# Patient Record
Sex: Female | Born: 1980 | Race: White | Hispanic: No | Marital: Married | Smoking: Never smoker
Health system: Southern US, Community
[De-identification: ages and names within clinical notes are randomized; demographics above are authoritative.]

## PROBLEM LIST (undated history)

## (undated) DIAGNOSIS — R600 Localized edema: Secondary | ICD-10-CM

## (undated) DIAGNOSIS — E282 Polycystic ovarian syndrome: Secondary | ICD-10-CM

## (undated) DIAGNOSIS — I10 Essential (primary) hypertension: Secondary | ICD-10-CM

## (undated) DIAGNOSIS — M549 Dorsalgia, unspecified: Secondary | ICD-10-CM

## (undated) DIAGNOSIS — F32A Depression, unspecified: Secondary | ICD-10-CM

## (undated) DIAGNOSIS — F329 Major depressive disorder, single episode, unspecified: Secondary | ICD-10-CM

## (undated) HISTORY — DX: Essential (primary) hypertension: I10

## (undated) HISTORY — PX: OTHER SURGICAL HISTORY: SHX169

## (undated) HISTORY — DX: Depression, unspecified: F32.A

## (undated) HISTORY — DX: Polycystic ovarian syndrome: E28.2

## (undated) HISTORY — DX: Localized edema: R60.0

## (undated) HISTORY — DX: Dorsalgia, unspecified: M54.9

---
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
## 2003-02-26 ENCOUNTER — Other Ambulatory Visit: Admission: RE | Admit: 2003-02-26 | Discharge: 2003-02-26 | Payer: Self-pay | Admitting: Obstetrics and Gynecology

## 2003-11-17 ENCOUNTER — Emergency Department (HOSPITAL_COMMUNITY): Admission: EM | Admit: 2003-11-17 | Discharge: 2003-11-17 | Payer: Self-pay | Admitting: Family Medicine

## 2004-04-22 ENCOUNTER — Other Ambulatory Visit: Admission: RE | Admit: 2004-04-22 | Discharge: 2004-04-22 | Payer: Self-pay | Admitting: Obstetrics and Gynecology

## 2004-11-23 ENCOUNTER — Encounter: Admission: RE | Admit: 2004-11-23 | Discharge: 2004-11-23 | Payer: Self-pay | Admitting: Internal Medicine

## 2005-05-19 ENCOUNTER — Other Ambulatory Visit: Admission: RE | Admit: 2005-05-19 | Discharge: 2005-05-19 | Payer: Self-pay | Admitting: Obstetrics and Gynecology

## 2007-04-13 ENCOUNTER — Ambulatory Visit: Payer: Self-pay | Admitting: Family Medicine

## 2007-04-13 ENCOUNTER — Encounter: Admission: RE | Admit: 2007-04-13 | Discharge: 2007-04-13 | Payer: Self-pay | Admitting: Family Medicine

## 2007-04-17 ENCOUNTER — Telehealth: Payer: Self-pay | Admitting: *Deleted

## 2008-03-26 ENCOUNTER — Inpatient Hospital Stay (HOSPITAL_COMMUNITY): Admission: AD | Admit: 2008-03-26 | Discharge: 2008-03-29 | Payer: Self-pay | Admitting: Obstetrics and Gynecology

## 2008-11-26 ENCOUNTER — Ambulatory Visit: Payer: Self-pay | Admitting: Family Medicine

## 2008-11-28 ENCOUNTER — Ambulatory Visit: Payer: Self-pay | Admitting: Family Medicine

## 2010-09-14 NOTE — Op Note (Signed)
[name], [name]               ACCOUNT NO.:  [id number]   MEDICAL RECORD NO.:  [id number]          PATIENT TYPE:  INP   LOCATION:  9122                          FACILITY:  [facility]   PHYSICIAN:  Sherron Monday, MD        DATE OF BIRTH:  27-Nov-1980   DATE OF PROCEDURE:  03/27/2008  DATE OF DISCHARGE:                               OPERATIVE REPORT   PREOPERATIVE DIAGNOSES:  Intrauterine pregnancy at term, arrest of  cervical dilatation.   POSTOPERATIVE DIAGNOSES:  Intrauterine pregnancy at term, arrest of  cervical dilatation, delivered.   PROCEDURE:  Primary low transverse cesarean section.   SURGEON:  Sherron Monday, MD   ANESTHESIA:  Epidural.   COMPLICATIONS:  None.   PATHOLOGY:  None.   FINDINGS:  Viable female infant at 2:26 a.m. with Apgars of 9 at 1  minute and 9 at 5 minutes, and a weight of 8 pounds 3 ounces.  Normal  uterus, tubes, and ovaries are noted.   ESTIMATED BLOOD LOSS:  700 mL.   URINE OUTPUT:  300 mL, clear urine at the end of procedure.   IV FLUIDS:  1700 mL.   DISPOSITION:  Stable to PACU following the procedure.   PROCEDURE:  After informed consent was reviewed, the patient including  the risks, benefits, and alternatives of surgical procedure, she was  transferred to the operating room where her epidural was dosed and she  was placed on the table in a supine position with a leftward tilt.  Pfannenstiel incision was made in the level of two fingerbreadths above  the pubic symphysis carried through the underlying layer of fascia  sharply.  The fascia was incised in midline.  The incision was extended  laterally with Mayo scissors.  Inferior aspect of the fascial incision  was grasped with Kocher clamps, elevating the rectus muscles that were  dissected off both bluntly and sharply.  In a similar fashion, the  superior aspect of the fascial incision was grasped with Kocher clamps,  elevating the rectus muscles that were dissected off both bluntly and  sharply.  The midline was easily identified.  Peritoneum was entered  bluntly.  Incision was extended superiorly and inferiorly with good  visualization of the bladder.  The Alexis skin retractor was placed and  carefully checking to make sure no bowel was entrapped.  The  vesicouterine peritoneum was easily identified and picked up with  pickups and using Metzenbaum scissors, bladder flap was created  digitally and sharply.  The uterus was then incised in a transverse  fashion.  The infant was delivered from a vertex presentation and  occiput posterior presentation without difficulty.  Nose and mouth were  suctioned on the field.  Cord was clamped and cut.  The infant was  handed off to awaiting pediatric staff.  The placenta was expressed from  the uterus.  The uterus was cleared of all clots and debris exteriorized  and the uterine incision was closed in 2 layers with 0 Monocryl, the  first which was running locked, the second was imbricating.  The pelvis  was irrigated  copiously.  The uterus was returned to its place.  Hemostasis was assured.  The Alexis retractor was removed.  The  subfascial planes were inspected, found to be hemostatic.  The fascia  was closed in 0 Vicryl in a running fashion.  Subcuticular adipose layer  was irrigated and made hemostatic with Bovie cautery, 3-0 plain gut  incision was placed to reapproximated adipose tissue.  The skin was  closed with staples.  Sponge, lap, and needle counts were correct x2 at  the end of the procedure.  The patient tolerated the procedure well and  sent in stable condition to the PACU.      Sherron Monday, MD  Electronically Signed     JB/MEDQ  D:  03/27/2008  T:  03/27/2008  Job:  [id number]

## 2010-09-14 NOTE — Discharge Summary (Signed)
[name], [name]               ACCOUNT NO.:  [id number]   MEDICAL RECORD NO.:  [id number]          PATIENT TYPE:  INP   LOCATION:  9122                          FACILITY:  [facility]   PHYSICIAN:  Zenaida Niece, M.D.DATE OF BIRTH:  09-17-1980   DATE OF ADMISSION:  03/26/2008  DATE OF DISCHARGE:  03/29/2008                               DISCHARGE SUMMARY   ADMISSION DIAGNOSIS:  Intrauterine pregnancy at 40 weeks.   DISCHARGE DIAGNOSES:  1. Intrauterine pregnancy at 40 weeks.  2. Arrest of dilation.   PROCEDURES:  On March 27, 2008, Dr. Ellyn Hack, performed primary  cesarean section.   HISTORY AND PHYSICAL:  This is a 30 year old gravida 1, para 0 with an  EGA of [redacted] weeks' who presents with increasing contractions.  Prenatal  care was essentially uncomplicated.   PRENATAL LABS:  Blood type is A positive with negative antibody screen,  gonorrhea and Chlamydia negative, RPR nonreactive, rubella equivocal,  hepatitis B surface antigen negative, HIV negative, group B strep is  negative, 1-hour Glucola 105, cystic fibrosis negative.   PAST HISTORY:  Essentially noncontributory.   PHYSICAL EXAMINATION:  She is afebrile with stable vital signs.  Fetal  heart tracing reactive with contractions every 3-5 minutes.  Abdomen is  soft.  Fundus is nontender.  Cervical exam is not mentioned, although  membranes were ruptured for light meconium.  PIH labs were normal are  reported as normal.   HOSPITAL COURSE:  The patient was admitted and had a protracted course.  On the evening of March 26, 2008, Dr. Ellyn Hack inserted an IUPC.  Early  on the morning of March 27, 2008, she had been 7 cm for greater than  2 hours.  Dr. Ellyn Hack took her to the operating room and performed  primary low transverse cesarean section under epidural anesthesia.  She  delivered a viable female infant with Apgars of 9 and 9, weight 8 pounds  3 ounces.  The patient had normal anatomy and estimated blood loss was  700 mL.  Postoperatively, she had no significant complications.  Predelivery hemoglobin is 11.9 and postoperative is 8.6.  On  postoperative #2, the patient was doing well and requested discharge  home.  Her incision was healing well, and she was felt to be stable  enough for discharge home.   DISCHARGE INSTRUCTIONS:  Regular diet, pelvic rest, no strenuous  activity.  Follow up is in 3-4 days for staple removal.   MEDICATIONS:  Percocet, #30, one to two p.o. q.4-6 h. p.r.n. pain and  over-the-counter ibuprofen as needed and she was given our discharge  pamphlet.      Zenaida Niece, M.D.  Electronically Signed     TDM/MEDQ  D:  03/29/2008  T:  03/29/2008  Job:  [id number]

## 2011-02-01 LAB — LACTATE DEHYDROGENASE: LDH: 337 — ABNORMAL HIGH

## 2011-02-01 LAB — CBC
HCT: 25.2 — ABNORMAL LOW
Hemoglobin: 11.9 — ABNORMAL LOW
MCHC: 33.9
MCHC: 34
MCHC: 34.1
MCV: 89.1
MCV: 89.6
Platelets: 176
RBC: 2.81 — ABNORMAL LOW
RBC: 3.95
RDW: 14.7
RDW: 15.1
WBC: 11.8 — ABNORMAL HIGH

## 2011-02-01 LAB — COMPREHENSIVE METABOLIC PANEL
ALT: 9
AST: 31
Alkaline Phosphatase: 66
Chloride: 106
Creatinine, Ser: 0.54
GFR calc Af Amer: >60
Sodium: 137

## 2011-02-01 LAB — RPR: RPR Ser Ql: NONREACTIVE

## 2017-09-10 ENCOUNTER — Other Ambulatory Visit: Payer: Self-pay

## 2017-09-10 ENCOUNTER — Encounter (HOSPITAL_COMMUNITY): Payer: Self-pay | Admitting: Emergency Medicine

## 2017-09-10 ENCOUNTER — Emergency Department (HOSPITAL_COMMUNITY)
Admission: EM | Admit: 2017-09-10 | Discharge: 2017-09-10 | Disposition: A | Discharge status: Home / Self Care | Payer: Self-pay | Attending: Emergency Medicine | Admitting: Emergency Medicine

## 2017-09-10 DIAGNOSIS — R002 Palpitations: Secondary | ICD-10-CM | POA: Insufficient documentation

## 2017-09-10 DIAGNOSIS — R55 Syncope and collapse: Secondary | ICD-10-CM | POA: Insufficient documentation

## 2017-09-10 DIAGNOSIS — Z79899 Other long term (current) drug therapy: Secondary | ICD-10-CM | POA: Insufficient documentation

## 2017-09-10 LAB — CBC
HEMATOCRIT: 39.7 % (ref 36.0–46.0)
HEMOGLOBIN: 12.5 g/dL (ref 12.0–15.0)
MCH: 27 pg (ref 26.0–34.0)
MCHC: 31.5 g/dL (ref 30.0–36.0)
MCV: 85.7 fL (ref 78.0–100.0)
Platelets: 255 10*3/uL (ref 150–400)
RBC: 4.63 MIL/uL (ref 3.87–5.11)
RDW: 15.3 % (ref 11.5–15.5)
WBC: 10.4 10*3/uL (ref 4.0–10.5)

## 2017-09-10 LAB — BASIC METABOLIC PANEL
ANION GAP: 7 (ref 5–15)
BUN: 11 mg/dL (ref 6–20)
CHLORIDE: 108 mmol/L (ref 101–111)
CO2: 27 mmol/L (ref 22–32)
Calcium: 9.1 mg/dL (ref 8.9–10.3)
Creatinine, Ser: 0.89 mg/dL (ref 0.44–1.00)
GFR calc Af Amer: >60 mL/min (ref >60)
GLUCOSE: 140 mg/dL — AB (ref 65–99)
POTASSIUM: 4.1 mmol/L (ref 3.5–5.1)
Sodium: 142 mmol/L (ref 135–145)

## 2017-09-10 LAB — URINALYSIS, ROUTINE W REFLEX MICROSCOPIC
BILIRUBIN URINE: NEGATIVE
Glucose, UA: NEGATIVE mg/dL
Hgb urine dipstick: NEGATIVE
KETONES UR: NEGATIVE mg/dL
LEUKOCYTES UA: NEGATIVE
NITRITE: NEGATIVE
PH: 5 (ref 5.0–8.0)
Protein, ur: NEGATIVE mg/dL
SPECIFIC GRAVITY, URINE: 1.005 (ref 1.005–1.030)

## 2017-09-10 LAB — I-STAT BETA HCG BLOOD, ED (MC, WL, AP ONLY): I-stat hCG, quantitative: <5 m[IU]/mL (ref <5)

## 2017-09-10 MED ORDER — SODIUM CHLORIDE 0.9 % IV SOLN
1000.0000 mL | INTRAVENOUS | Status: DC
  Administered 2017-09-10: 1000 mL via INTRAVENOUS

## 2017-09-10 MED ORDER — SODIUM CHLORIDE 0.9 % IV BOLUS (SEPSIS)
1000.0000 mL | Freq: Once | INTRAVENOUS | Status: AC
  Administered 2017-09-10: 1000 mL via INTRAVENOUS

## 2017-09-10 NOTE — ED Provider Notes (Signed)
[facility] EMERGENCY DEPARTMENT Provider Note   CSN: [id number] Arrival date & time: 09/10/17  1430     History   Chief Complaint Chief Complaint  Patient presents with  . Near Syncope    HPI [name] is a 37 y.o. female.  HPI Pt was driving when she had sudden onset of feeling lightheaded, short of breath and her heart was racing.  Pt had to pull over on the side of the road.  She was hoping it would pass but it did not resolve.   After about an 1hr and 30 minutes it slowly resolved.  SHe is feeling better now.  She has never had this happen before although she has had some fainting spells after painful conditions etc. History reviewed. No pertinent past medical history.  There are no active problems to display for this patient.   Past Surgical History:  Procedure Laterality Date  . CESAREAN SECTION       OB History   None      Home Medications    Prior to Admission medications   Medication Sig Start Date End Date Taking? Authorizing Provider  ibuprofen (ADVIL,MOTRIN) 200 MG tablet Take 200 mg by mouth every 6 (six) hours as needed for fever, headache or mild pain.   Yes [provider]  ranitidine (ZANTAC) 150 MG tablet Take 150 mg by mouth 2 (two) times daily.   Yes [provider]    Family History No family history on file.  Social History Social History   Tobacco Use  . Smoking status: Not on file  Substance Use Topics  . Alcohol use: Not on file  . Drug use: Not on file     Allergies   Patient has no known allergies.   Review of Systems Review of Systems  Constitutional: Negative for fever.  Respiratory: Positive for shortness of breath. Negative for cough.   Cardiovascular: Negative for chest pain.  Gastrointestinal: Negative for abdominal pain, diarrhea and vomiting.  Genitourinary: Negative for dysuria.  Neurological: Negative for headaches.     Physical Exam Updated Vital Signs BP 132/88    Pulse 91   Resp 16   Ht 1.803 m ( )   Wt 113.4 kg (250 lb)   LMP  (Within Months)   SpO2 100%   BMI 34.87 kg/m   Physical Exam  Constitutional: She appears well-developed and well-nourished. No distress.  HENT:  Head: Normocephalic and atraumatic.  Right Ear: External ear normal.  Left Ear: External ear normal.  Eyes: Conjunctivae are normal. Right eye exhibits no discharge. Left eye exhibits no discharge. No scleral icterus.  Neck: Neck supple. No tracheal deviation present.  Cardiovascular: Normal rate, regular rhythm and intact distal pulses.  Pulmonary/Chest: Effort normal and breath sounds normal. No stridor. No respiratory distress. She has no wheezes. She has no rales.  Abdominal: Soft. Bowel sounds are normal. She exhibits no distension. There is no tenderness. There is no rebound and no guarding.  Musculoskeletal: She exhibits no edema or tenderness.  Neurological: She is alert. She has normal strength. No cranial nerve deficit (no facial droop, extraocular movements intact, no slurred speech) or sensory deficit. She exhibits normal muscle tone. She displays no seizure activity. Coordination normal.  Skin: Skin is warm and dry. No rash noted.  Psychiatric: She has a normal mood and affect.  Nursing note and vitals reviewed.    ED Treatments / Results  Labs (all labs ordered are listed, but only abnormal  results are displayed) Labs Reviewed  BASIC METABOLIC PANEL - Abnormal; Notable for the following components:      Result Value   Glucose, Bld 140 (*)    All other components within normal limits  URINALYSIS, ROUTINE W REFLEX MICROSCOPIC - Abnormal; Notable for the following components:   APPearance HAZY (*)    All other components within normal limits  CBC  I-STAT BETA HCG BLOOD, ED ([facility], [facility], [facility] ONLY)  CBG MONITORING, ED    EKG EKG Interpretation  Date/Time:  Sunday Sep 10 2017 14:38:43 EDT Ventricular Rate:  116 PR Interval:  154 QRS Duration: 82 QT  Interval:  312 QTC Calculation: 433 R Axis:   66 Text Interpretation:  Sinus tachycardia Otherwise normal ECG No old tracing to compare Confirmed by Linwood Dibbles [phone]) on 09/10/2017 3:49:11 PM   Radiology No results found.  Procedures Procedures (including critical care time)  Medications Ordered in ED Medications  sodium chloride 0.9 % bolus 1,000 mL (1,000 mLs Intravenous New Bag/Given 09/10/17 1741)    Followed by  0.9 %  sodium chloride infusion (1,000 mLs Intravenous New Bag/Given 09/10/17 1742)     Initial Impression / Assessment and Plan / ED Course  I have reviewed the triage vital signs and the nursing notes.  Pertinent labs & imaging results that were available during my care of the patient were reviewed by me and considered in my medical decision making (see chart for details).   Pt presented to the ED for evaluation of near syncope.  Patient had an episode where she felt like she was going to pass out her heart was racing.  Symptoms pretty much resolved by the time she arrived here.  Her EKG did not show any dysrhythmia other than a sinus tachycardia.  Patient's laboratory tests are reassuring.  He heart rhythm has rate has returned to a normal rate.  She does not have any complaints of headache or chest pain.  No abdominal pain.  No signs of acute infection.  No anemia or dehydration.  It is possible the patient may have had an episode of a supraventricular tachycardia.  Discussed outpatient follow-up with her primary care doctor.  If she has any recurrent symptoms they may consider an outpatient Holter monitor.  Final Clinical Impressions(s) / ED Diagnoses   Final diagnoses:  Near syncope  Palpitations    ED Discharge Orders    None       Linwood Dibbles, MD 09/10/17 1821

## 2017-09-10 NOTE — Discharge Instructions (Addendum)
Follow-up with your primary care doctor, discuss possible outpatient heart monitor, return as needed for recurrent symptoms

## 2017-09-10 NOTE — ED Triage Notes (Signed)
Patient complains of sudden near syncope while driving. Patient states she has never felt anything similar before. Patient reports feeling like her heart is racing and feeling like she will pass out.

## 2017-09-11 ENCOUNTER — Other Ambulatory Visit (HOSPITAL_COMMUNITY): Payer: Self-pay | Admitting: *Deleted

## 2017-09-11 DIAGNOSIS — I471 Supraventricular tachycardia: Secondary | ICD-10-CM

## 2017-09-11 MED ORDER — METOPROLOL TARTRATE 25 MG PO TABS: 25.0000 mg | ORAL_TABLET | Freq: Two times a day (BID) | ORAL | 3 refills | Status: DC

## 2019-08-27 ENCOUNTER — Ambulatory Visit (INDEPENDENT_AMBULATORY_CARE_PROVIDER_SITE_OTHER): Payer: Medicaid Other | Admitting: Family Medicine

## 2019-08-27 ENCOUNTER — Other Ambulatory Visit: Payer: Self-pay

## 2019-08-27 ENCOUNTER — Encounter (INDEPENDENT_AMBULATORY_CARE_PROVIDER_SITE_OTHER): Payer: Self-pay | Admitting: Family Medicine

## 2019-08-27 VITALS — BP 134/91 | HR 91 | Temp 98.5°F | Ht 71.0 in | Wt 301.0 lb

## 2019-08-27 DIAGNOSIS — E282 Polycystic ovarian syndrome: Secondary | ICD-10-CM | POA: Diagnosis not present

## 2019-08-27 DIAGNOSIS — R0602 Shortness of breath: Secondary | ICD-10-CM | POA: Diagnosis not present

## 2019-08-27 DIAGNOSIS — I10 Essential (primary) hypertension: Secondary | ICD-10-CM

## 2019-08-27 DIAGNOSIS — Z1331 Encounter for screening for depression: Secondary | ICD-10-CM

## 2019-08-27 DIAGNOSIS — R5383 Other fatigue: Secondary | ICD-10-CM

## 2019-08-27 DIAGNOSIS — Z6841 Body Mass Index (BMI) 40.0 and over, adult: Secondary | ICD-10-CM

## 2019-08-27 DIAGNOSIS — R7301 Impaired fasting glucose: Secondary | ICD-10-CM

## 2019-08-27 DIAGNOSIS — Z0289 Encounter for other administrative examinations: Secondary | ICD-10-CM

## 2019-08-27 NOTE — Progress Notes (Signed)
Chief Complaint:   OBESITY [name] (MR# [id number]) is a 39 y.o. female who presents for evaluation and treatment of obesity and related comorbidities. Current BMI is Body mass index is 41.98 kg/m. [name] has been struggling with her weight for many years and has been unsuccessful in either losing weight, maintaining weight loss, or reaching her healthy weight goal.  [name] is currently in the action stage of change and ready to dedicate time achieving and maintaining a healthier weight. [name] is interested in becoming our patient and working on intensive lifestyle modifications including (but not limited to) diet and exercise for weight loss.  [name] has a history of PCOS.  Her weight gain has occurred over a few years. She prefers a low carb diet.  [name]'s habits were reviewed today and are as follows: Her family eats meals together, she thinks her family will eat healthier with her, her desired weight loss is 130 pounds, she has been heavy most of her adult life, her heaviest weight ever was 300 pounds, she craves sugary foods, she skips breakfast a few times a week, she is frequently drinking liquids with calories, she frequently makes poor food choices, she sometimes eats larger portions than normal and she struggles with emotional eating.  Depression Screen [name]'s Food and Mood (modified18 PHQ-9) Wong was 18.  Depression screen [facility] 2/9 08/27/2019  Decreased Interest 3  Down, Depressed, Hopeless 3  PHQ - 2 Wong 6  Altered sleeping 2  Tired, decreased energy 3  Change in appetite 3  Feeling bad or failure about yourself  3  Trouble concentrating 0  Moving slowly or fidgety/restless 1  Suicidal thoughts 0  PHQ-9 Wong 18  Difficult doing work/chores Somewhat difficult   Subjective:   1. Fatigue, unspecified type Norma denies daytime somnolence and reports waking up still tired. Patent has a history of symptoms of morning fatigue. [name] generally gets 7 or 8 hours of sleep per  night, and states that she has generally restful sleep. [name] is present if she is on her back. [name] episodes are not present. [name] is 2.  2. SOB (shortness of breath) on exertion [facility] notes increasing shortness of breath with exercising and seems to be worsening over time with weight gain. She notes getting out of breath sooner with activity than she used to. This has gotten worse recently. [name] denies shortness of breath at rest or orthopnea.  3. PCOS (polycystic ovarian syndrome) [name] was having a hard time with conception and was diagnosed with PCOS at that time.  4. Essential hypertension Review: taking medications as instructed, no medication side effects noted, no chest pain on exertion, no dyspnea on exertion, no swelling of ankles.  Blood pressure has been elevated over the last 6 months.  She tried a beta blocker and it made her tired.  BP Readings from Last 3 Encounters:  08/27/19 (!) 134/91  09/10/17 132/88   5. Fasting hyperglycemia [name] has a history of some elevated blood glucose readings without a diagnosis of diabetes. She denies polyphagia.  6. Depression screening [name] was screened for depression as part of her new patient workup today.  Assessment/Plan:   1. Fatigue, unspecified type [name] does not feel that her weight is causing her energy to be lower than it should be. Fatigue may be related to obesity, depression or many other causes. Labs will be ordered, and in the meanwhile, [name] will focus on self care including making healthy food choices, increasing physical  activity and focusing on stress reduction.  Orders - EKG 12-Lead  2. SOB (shortness of breath) on exertion [name] does not feel that she gets out of breath more easily that she used to when she exercises. [name]'s shortness of breath appears to be obesity related and exercise induced. She has agreed to work on weight loss and gradually increase exercise to treat her exercise induced  shortness of breath. Will continue to monitor closely.  3. PCOS (polycystic ovarian syndrome) Intensive lifestyle modifications are first line treatment for this issue. We discussed several lifestyle modifications today and she will continue to work on diet, exercise and weight loss efforts. Orders and follow up as documented in patient record.  Counseling . PCOS is a leading cause of menstrual irregularities and infertility. It is also associated with obesity, hirsutism (excessive hair growth on the face, chest, or back), and cardiovascular risk factors such as high cholesterol and insulin resistance. . Insulin resistance appears to play a central role.  . Women with PCOS have been shown to have impaired appetite-regulating hormones. . Metformin is one medication that can improve metabolic parameters.  . Women with polycystic ovary syndrome (PCOS) have an increased risk for cardiovascular disease (CVD) - European Journal of Preventive Cardiology.  4. Essential hypertension [name] is working on healthy weight loss and exercise to improve blood pressure control. We will watch for signs of hypotension as she continues her lifestyle modifications.  5. Fasting hyperglycemia Fasting labs will be obtained and results with be discussed with [name] in 2 weeks at her follow up visit. In the meanwhile [name] was started on a lower simple carbohydrate diet and will work on weight loss efforts.  6. Depression screening [name] had a positive depression screening. Depression is commonly associated with obesity and often results in emotional eating behaviors. We will monitor this closely and work on CBT to help improve the non-hunger eating patterns. Referral to Psychology may be required if no improvement is seen as she continues in our clinic.  PHQ-9 was 18 today.  7. Class 3 severe obesity with serious comorbidity and body mass index (BMI) of 40.0 to 44.9 in adult, unspecified obesity type [facility]) [name] is currently  in the action stage of change and her goal is to continue with weight loss efforts. I recommend Spirit begin the structured treatment plan as follows:  She has agreed to the Category 4 Plan.  Exercise goals: No exercise has been prescribed at this time.   Behavioral modification strategies: increasing lean protein intake, decreasing simple carbohydrates, increasing vegetables, increasing water intake and decreasing liquid calories.  She was informed of the importance of frequent follow-up visits to maximize her success with intensive lifestyle modifications for her multiple health conditions. She was informed we would discuss her lab results at her next visit unless there is a critical issue that needs to be addressed sooner. Keali agreed to keep her next visit at the agreed upon time to discuss these results.  Orders Placed This Encounter  Procedures  . Comprehensive metabolic panel  . CBC with Differential/Platelet  . Hemoglobin A1c  . Insulin, random  . Lipid panel  . VITAMIN D 25 Hydroxy (Vit-D Deficiency, Fractures)  . TSH  . T4, free  . T3  . Anemia panel  . EKG 12-Lead   Objective:   Blood pressure (!) 134/91, pulse 91, temperature 98.5 F (36.9 C), temperature source Oral, height 5\' 11"  (1.803 m), weight (!) 301 lb (136.5 kg), last menstrual period 06/28/2019, SpO2  98 %. Body mass index is 41.98 kg/m.  EKG: Normal sinus rhythm, rate 88 bpm.  Indirect Calorimeter completed today shows a VO2 of 378 and a REE of 2631.  Her calculated basal metabolic rate is 1761 thus her basal metabolic rate is better than expected.  General: Cooperative, alert, well developed, in no acute distress. HEENT: Conjunctivae and lids unremarkable. Cardiovascular: Regular rhythm.  Lungs: Normal work of breathing. Neurologic: No focal deficits.   Lab Results  Component Value Date   CREATININE 0.89 09/10/2017   BUN 11 09/10/2017   NA 142 09/10/2017   K 4.1 09/10/2017   CL 108 09/10/2017   CO2  27 09/10/2017   Lab Results  Component Value Date   ALT 9 03/26/2008   AST 31 03/26/2008   ALKPHOS 66 03/26/2008   BILITOT 1.5 (H) 03/26/2008   Lab Results  Component Value Date   WBC 10.4 09/10/2017   HGB 12.5 09/10/2017   HCT 39.7 09/10/2017   MCV 85.7 09/10/2017   PLT 255 09/10/2017   Attestation Statements:   This is the patient's first visit at [facility]. The patient's NEW PATIENT PACKET was reviewed at length. Included in the packet: current and past health history, medications, allergies, ROS, gynecologic history (women only), surgical history, family history, social history, weight history, weight loss surgery history (for those that have had weight loss surgery), nutritional evaluation, mood and food questionnaire, PHQ9, Dorothy questionnaire, sleep habits questionnaire, patient life and health improvement goals questionnaire. These will all be scanned into the patient's chart under media.   During the visit, I independently reviewed the patient's EKG, bioimpedance scale results, and indirect calorimeter results. I used this information to tailor a meal plan for the patient that will help her to lose weight and will improve her obesity-related conditions going forward. I performed a medically necessary appropriate examination and/or evaluation. I discussed the assessment and treatment plan with the patient. The patient was provided an opportunity to ask questions and all were answered. The patient agreed with the plan and demonstrated an understanding of the instructions. Labs were ordered at this visit and will be reviewed at the next visit unless more critical results need to be addressed immediately. Clinical information was updated and documented in the EMR.   I, Insurance claims handler, CMA, am acting as Energy manager for [employer], DO.  I have reviewed the above documentation for accuracy and completeness, and I agree with the above. Helane Rima, DO

## 2019-08-28 LAB — COMPREHENSIVE METABOLIC PANEL
ALT: 21 IU/L (ref 0–32)
AST: 25 IU/L (ref 0–40)
Albumin/Globulin Ratio: 1.7 (ref 1.2–2.2)
Albumin: 4.3 g/dL (ref 3.8–4.8)
Alkaline Phosphatase: 87 IU/L (ref 39–117)
BUN/Creatinine Ratio: 14 (ref 9–23)
BUN: 10 mg/dL (ref 6–20)
Bilirubin Total: 0.4 mg/dL (ref 0.0–1.2)
CO2: 23 mmol/L (ref 20–29)
Calcium: 9.3 mg/dL (ref 8.7–10.2)
Chloride: 106 mmol/L (ref 96–106)
Creatinine, Ser: 0.69 mg/dL (ref 0.57–1.00)
GFR calc Af Amer: 128 mL/min/{1.73_m2} (ref >59)
GFR calc non Af Amer: 111 mL/min/{1.73_m2} (ref >59)
Globulin, Total: 2.6 g/dL (ref 1.5–4.5)
Glucose: 116 mg/dL — ABNORMAL HIGH (ref 65–99)
Potassium: 4.4 mmol/L (ref 3.5–5.2)
Sodium: 141 mmol/L (ref 134–144)
Total Protein: 6.9 g/dL (ref 6.0–8.5)

## 2019-08-28 LAB — CBC WITH DIFFERENTIAL/PLATELET
Basophils Absolute: 0 10*3/uL (ref 0.0–0.2)
Basos: 0 %
EOS (ABSOLUTE): 0.2 10*3/uL (ref 0.0–0.4)
Eos: 2 %
Hemoglobin: 12.7 g/dL (ref 11.1–15.9)
Immature Grans (Abs): 0.1 10*3/uL (ref 0.0–0.1)
Immature Granulocytes: 1 %
Lymphocytes Absolute: 2.2 10*3/uL (ref 0.7–3.1)
Lymphs: 24 %
MCH: 27 pg (ref 26.6–33.0)
MCHC: 31.7 g/dL (ref 31.5–35.7)
MCV: 85 fL (ref 79–97)
Monocytes Absolute: 0.4 10*3/uL (ref 0.1–0.9)
Monocytes: 5 %
Neutrophils Absolute: 6.4 10*3/uL (ref 1.4–7.0)
Neutrophils: 68 %
Platelets: 219 10*3/uL (ref 150–450)
RBC: 4.7 x10E6/uL (ref 3.77–5.28)
RDW: 13.9 % (ref 11.7–15.4)
WBC: 9.2 10*3/uL (ref 3.4–10.8)

## 2019-08-28 LAB — ANEMIA PANEL
Ferritin: 38 ng/mL (ref 15–150)
Folate, Hemolysate: 414 ng/mL
Folate, RBC: 1032 ng/mL (ref >498)
Hematocrit: 40.1 % (ref 34.0–46.6)
Iron Saturation: 24 % (ref 15–55)
Iron: 85 ug/dL (ref 27–159)
Retic Ct Pct: 1.8 % (ref 0.6–2.6)
Total Iron Binding Capacity: 361 ug/dL (ref 250–450)
UIBC: 276 ug/dL (ref 131–425)
Vitamin B-12: 569 pg/mL (ref 232–1245)

## 2019-08-28 LAB — TSH: TSH: 2.56 u[IU]/mL (ref 0.450–4.500)

## 2019-08-28 LAB — T3: T3, Total: 169 ng/dL (ref 71–180)

## 2019-08-28 LAB — LIPID PANEL
Chol/HDL Ratio: 4.9 ratio — ABNORMAL HIGH (ref 0.0–4.4)
Cholesterol, Total: 183 mg/dL (ref 100–199)
HDL: 37 mg/dL — ABNORMAL LOW (ref >39)
LDL Chol Calc (NIH): 122 mg/dL — ABNORMAL HIGH (ref 0–99)
Triglycerides: 133 mg/dL (ref 0–149)
VLDL Cholesterol Cal: 24 mg/dL (ref 5–40)

## 2019-08-28 LAB — HEMOGLOBIN A1C
Est. average glucose Bld gHb Est-mCnc: 143 mg/dL
Hgb A1c MFr Bld: 6.6 % — ABNORMAL HIGH (ref 4.8–5.6)

## 2019-08-28 LAB — VITAMIN D 25 HYDROXY (VIT D DEFICIENCY, FRACTURES): Vit D, 25-Hydroxy: 37.1 ng/mL (ref 30.0–100.0)

## 2019-08-28 LAB — T4, FREE: Free T4: 0.93 ng/dL (ref 0.82–1.77)

## 2019-08-28 LAB — INSULIN, RANDOM: INSULIN: 52.8 u[IU]/mL — ABNORMAL HIGH (ref 2.6–24.9)

## 2019-09-10 ENCOUNTER — Ambulatory Visit (INDEPENDENT_AMBULATORY_CARE_PROVIDER_SITE_OTHER): Payer: Medicaid Other | Admitting: Family Medicine

## 2019-09-10 ENCOUNTER — Encounter (INDEPENDENT_AMBULATORY_CARE_PROVIDER_SITE_OTHER): Payer: Self-pay | Admitting: Family Medicine

## 2019-09-10 ENCOUNTER — Other Ambulatory Visit: Payer: Self-pay

## 2019-09-10 VITALS — BP 132/84 | HR 89 | Temp 98.5°F | Ht 71.0 in | Wt 298.0 lb

## 2019-09-10 DIAGNOSIS — E785 Hyperlipidemia, unspecified: Secondary | ICD-10-CM

## 2019-09-10 DIAGNOSIS — Z6841 Body Mass Index (BMI) 40.0 and over, adult: Secondary | ICD-10-CM

## 2019-09-10 DIAGNOSIS — E1169 Type 2 diabetes mellitus with other specified complication: Secondary | ICD-10-CM | POA: Diagnosis not present

## 2019-09-10 DIAGNOSIS — E119 Type 2 diabetes mellitus without complications: Secondary | ICD-10-CM

## 2019-09-10 DIAGNOSIS — R790 Abnormal level of blood mineral: Secondary | ICD-10-CM | POA: Diagnosis not present

## 2019-09-10 MED ORDER — METFORMIN HCL 500 MG PO TABS: 500.0000 mg | ORAL_TABLET | Freq: Every day | ORAL | 0 refills | Status: DC

## 2019-09-10 NOTE — Progress Notes (Signed)
Chief Complaint:   OBESITY [name] is here to discuss her progress with her obesity treatment plan along with follow-up of her obesity related diagnoses. [name] is on the Category 4 Plan and states she is following her eating plan approximately 100% of the time. [name] states she is exercising for 0 minutes 0 times per week.  Today's visit was #: 2 Starting weight: 301 lbs Starting date: 08/27/2019 Today's weight: 298 lbs Today's date: 09/10/2019 Total lbs lost to date: 3 lbs Total lbs lost since last in-office visit: 3 lbs  Interim History: [name] says she is tolerating the meal plan.  She has been craving sweets.  Subjective:   1. Type 2 diabetes mellitus without complication, without long-term current use of insulin ([name]) New onset.  Lab Results  Component Value Date   HGBA1C 6.6 (H) 08/27/2019   Lab Results  Component Value Date   LDLCALC 122 (H) 08/27/2019   CREATININE 0.69 08/27/2019   Lab Results  Component Value Date   INSULIN 52.8 (H) 08/27/2019   2. Hyperlipidemia associated with type 2 diabetes mellitus ([name]) [name] has hyperlipidemia and has been trying to improve her cholesterol levels with intensive lifestyle modification including a low saturated fat diet, exercise and weight loss. She denies any chest pain, claudication or myalgias.  Lab Results  Component Value Date   ALT 21 08/27/2019   AST 25 08/27/2019   ALKPHOS 87 08/27/2019   BILITOT 0.4 08/27/2019   Lab Results  Component Value Date   CHOL 183 08/27/2019   HDL 37 (L) 08/27/2019   LDLCALC 122 (H) 08/27/2019   TRIG 133 08/27/2019   CHOLHDL 4.9 (H) 08/27/2019   3. Low ferritin [name] is not a vegetarian.  She does not have a history of weight loss surgery.   CBC Latest Ref Rng & Units 08/27/2019 09/10/2017 03/28/2008  WBC 3.4 - 10.8 x10E3/uL 9.2 10.4 11.8(H)  Hemoglobin 11.1 - 15.9 g/dL 12.7 12.5 8.6 DELTA CHECK NOTED(L)  Hematocrit 34.0 - 46.6 % 40.1 39.7 25.2(L)  Platelets 150 - 450 x10E3/uL 219  255 176 DELTA CHECK NOTED   Lab Results  Component Value Date   IRON 85 08/27/2019   TIBC 361 08/27/2019   FERRITIN 38 08/27/2019   Lab Results  Component Value Date   VITAMINB12 569 08/27/2019   Assessment/Plan:   1. Type 2 diabetes mellitus without complication, without long-term current use of insulin (HCC) Good blood sugar control is important to decrease the likelihood of diabetic complications such as nephropathy, neuropathy, limb loss, blindness, coronary artery disease, and death. Intensive lifestyle modification including diet, exercise and weight loss are the first line of treatment for diabetes.   Orders - metFORMIN (GLUCOPHAGE) 500 MG tablet; Take 1 tablet (500 mg total) by mouth daily.  Dispense: 30 tablet; Refill: 0  2. Hyperlipidemia associated with type 2 diabetes mellitus ([place]) Cardiovascular risk and specific lipid/LDL goals reviewed.  We discussed several lifestyle modifications today and [name] will continue to work on diet, exercise and weight loss efforts. Orders and follow up as documented in patient record.   Counseling Intensive lifestyle modifications are the first line treatment for this issue. . Dietary changes: Increase soluble fiber. Decrease simple carbohydrates. . Exercise changes: Moderate to vigorous-intensity aerobic activity 150 minutes per week if tolerated. . Lipid-lowering medications: see documented in medical record.  3. Low ferritin Will continue to monitor.  4. Class 3 severe obesity with serious comorbidity and body mass index (BMI) of 40.0 to 44.9  in adult, unspecified obesity type [name]) [name] is currently in the action stage of change. As such, her goal is to continue with weight loss efforts. She has agreed to the Category 4 Plan.   Exercise goals: For substantial health benefits, adults should do at least 150 minutes (2 hours and 30 minutes) a week of moderate-intensity, or 75 minutes (1 hour and 15 minutes) a week of vigorous-intensity  aerobic physical activity, or an equivalent combination of moderate- and vigorous-intensity aerobic activity. Aerobic activity should be performed in episodes of at least 10 minutes, and preferably, it should be spread throughout the week.  Behavioral modification strategies: increasing lean protein intake and increasing vegetables.  [name] has agreed to follow-up with our clinic in 2 weeks. She was informed of the importance of frequent follow-up visits to maximize her success with intensive lifestyle modifications for her multiple health conditions.   Objective:   Blood pressure 132/84, pulse 89, temperature 98.5 F (36.9 C), temperature source Oral, height 5\' 11"  (1.803 m), weight 298 lb (135.2 kg), SpO2 95 %. Body mass index is 41.56 kg/m.  General: Cooperative, alert, well developed, in no acute distress. HEENT: Conjunctivae and lids unremarkable. Cardiovascular: Regular rhythm.  Lungs: Normal work of breathing. Neurologic: No focal deficits.   Lab Results  Component Value Date   CREATININE 0.69 08/27/2019   BUN 10 08/27/2019   NA 141 08/27/2019   K 4.4 08/27/2019   CL 106 08/27/2019   CO2 23 08/27/2019   Lab Results  Component Value Date   ALT 21 08/27/2019   AST 25 08/27/2019   ALKPHOS 87 08/27/2019   BILITOT 0.4 08/27/2019   Lab Results  Component Value Date   HGBA1C 6.6 (H) 08/27/2019   Lab Results  Component Value Date   INSULIN 52.8 (H) 08/27/2019   Lab Results  Component Value Date   TSH 2.560 08/27/2019   Lab Results  Component Value Date   CHOL 183 08/27/2019   HDL 37 (L) 08/27/2019   LDLCALC 122 (H) 08/27/2019   TRIG 133 08/27/2019   CHOLHDL 4.9 (H) 08/27/2019   Lab Results  Component Value Date   WBC 9.2 08/27/2019   HGB 12.7 08/27/2019   HCT 40.1 08/27/2019   MCV 85 08/27/2019   PLT 219 08/27/2019   Lab Results  Component Value Date   IRON 85 08/27/2019   TIBC 361 08/27/2019   FERRITIN 38 08/27/2019   Obesity Behavioral Intervention:    Approximately 15 minutes were spent on the discussion below.  ASK: We discussed the diagnosis of obesity with [name] today and [name] agreed to give [name] permission to discuss obesity behavioral modification therapy today.  ASSESS: [name] has the diagnosis of obesity and her BMI today is 41.6. [name] is in the action stage of change.   ADVISE: [name] was educated on the multiple health risks of obesity as well as the benefit of weight loss to improve her health. She was advised of the need for long term treatment and the importance of lifestyle modifications to improve her current health and to decrease her risk of future health problems.  AGREE: Multiple dietary modification options and treatment options were discussed and [name] agreed to follow the recommendations documented in the above note.  ARRANGE: [name] was educated on the importance of frequent visits to treat obesity as outlined per CMS and USPSTF guidelines and agreed to schedule her next follow up appointment today.  Attestation Statements:   Reviewed by clinician on day of visit:  allergies, medications, problem list, medical history, surgical history, family history, social history, and previous encounter notes.  I, Insurance claims handler, CMA, am acting as Energy manager for [employer], DO.  I have reviewed the above documentation for accuracy and completeness, and I agree with the above. Helane Rima, DO

## 2019-09-18 ENCOUNTER — Encounter (INDEPENDENT_AMBULATORY_CARE_PROVIDER_SITE_OTHER): Payer: Self-pay | Admitting: Family Medicine

## 2019-09-19 NOTE — Telephone Encounter (Signed)
Please advise. Thanks.  

## 2019-09-23 NOTE — Telephone Encounter (Signed)
FYI

## 2019-10-07 ENCOUNTER — Other Ambulatory Visit: Payer: Self-pay

## 2019-10-07 ENCOUNTER — Ambulatory Visit (INDEPENDENT_AMBULATORY_CARE_PROVIDER_SITE_OTHER): Payer: Medicaid Other | Admitting: Family Medicine

## 2019-10-07 ENCOUNTER — Encounter (INDEPENDENT_AMBULATORY_CARE_PROVIDER_SITE_OTHER): Payer: Self-pay | Admitting: Family Medicine

## 2019-10-07 VITALS — BP 142/90 | HR 80 | Temp 97.9°F | Ht 71.0 in | Wt 293.0 lb

## 2019-10-07 DIAGNOSIS — E1159 Type 2 diabetes mellitus with other circulatory complications: Secondary | ICD-10-CM | POA: Diagnosis not present

## 2019-10-07 DIAGNOSIS — E119 Type 2 diabetes mellitus without complications: Secondary | ICD-10-CM

## 2019-10-07 DIAGNOSIS — Z6841 Body Mass Index (BMI) 40.0 and over, adult: Secondary | ICD-10-CM | POA: Diagnosis not present

## 2019-10-07 DIAGNOSIS — I10 Essential (primary) hypertension: Secondary | ICD-10-CM

## 2019-10-07 MED ORDER — METFORMIN HCL 500 MG PO TABS: 500.0000 mg | ORAL_TABLET | Freq: Every day | ORAL | 0 refills | Status: DC

## 2019-10-07 NOTE — Progress Notes (Signed)
Chief Complaint:   OBESITY [name] is here to discuss her progress with her obesity treatment plan along with follow-up of her obesity related diagnoses. [name] is on the Category 4 Plan and states she is following her eating plan approximately 85% of the time. [name] states she is walking for 30 minutes 2-3 times per week.  Today's visit was #: 3 Starting weight: 301 lbs Starting date: 08/27/2019 Today's weight: 293 lbs Today's date: 10/07/2019 Total lbs lost to date: 8 lbs Total lbs lost since last in-office visit: 5 lbs  Interim History: [name] is down 8 pounds today.  She started taking metformin after last visit.  She endorses polyphagia, but this improved when she increased her calories by 100.  She also endorses fatigue.  Subjective:   1. Type 2 diabetes mellitus without complication, without long-term current use of insulin (HCC) Medications reviewed. Diabetic ROS: no polyuria or polydipsia, no chest pain, dyspnea or TIA's, no numbness, tingling or pain in extremities.   Lab Results  Component Value Date   HGBA1C 6.6 (H) 08/27/2019   Lab Results  Component Value Date   LDLCALC 122 (H) 08/27/2019   CREATININE 0.69 08/27/2019   Lab Results  Component Value Date   INSULIN 52.8 (H) 08/27/2019   2. Hypertension associated with diabetes (HCC) Review: taking medications as instructed, no medication side effects noted, no chest pain on exertion, no dyspnea on exertion, no swelling of ankles.   BP Readings from Last 3 Encounters:  10/07/19 (!) 142/90  09/10/19 132/84  08/27/19 (!) 134/91   Assessment/Plan:   1. Type 2 diabetes mellitus without complication, without long-term current use of insulin (HCC) Good blood sugar control is important to decrease the likelihood of diabetic complications such as nephropathy, neuropathy, limb loss, blindness, coronary artery disease, and death. Intensive lifestyle modification including diet, exercise and weight loss are the first line of  treatment for diabetes.   Orders - metFORMIN (GLUCOPHAGE) 500 MG tablet; Take 1 tablet (500 mg total) by mouth daily.  Dispense: 90 tablet; Refill: 0  2. Hypertension associated with diabetes (HCC) [place] is working on healthy weight loss and exercise to improve blood pressure control. We will watch for signs of hypotension as she continues her lifestyle modifications.  3. Class 3 severe obesity with serious comorbidity and body mass index (BMI) of 40.0 to 44.9 in adult, unspecified obesity type [facility]) [name] is currently in the action stage of change. As such, her goal is to continue with weight loss efforts. She has agreed to the Category 4 Plan.   Exercise goals: For substantial health benefits, adults should do at least 150 minutes (2 hours and 30 minutes) a week of moderate-intensity, or 75 minutes (1 hour and 15 minutes) a week of vigorous-intensity aerobic physical activity, or an equivalent combination of moderate- and vigorous-intensity aerobic activity. Aerobic activity should be performed in episodes of at least 10 minutes, and preferably, it should be spread throughout the week.  Behavioral modification strategies: increasing lean protein intake and increasing water intake.  [name] has agreed to follow-up with our clinic in 4 weeks. She was informed of the importance of frequent follow-up visits to maximize her success with intensive lifestyle modifications for her multiple health conditions.   Objective:   Blood pressure (!) 142/90, pulse 80, temperature 97.9 F (36.6 C), temperature source Oral, height 5\' 11"  (1.803 m), weight 293 lb (132.9 kg), SpO2 99 %. Body mass index is 40.87 kg/m.  General: Cooperative, alert, well  developed, in no acute distress. HEENT: Conjunctivae and lids unremarkable. Cardiovascular: Regular rhythm.  Lungs: Normal work of breathing. Neurologic: No focal deficits.   Lab Results  Component Value Date   CREATININE 0.69 08/27/2019   BUN 10 08/27/2019    NA 141 08/27/2019   K 4.4 08/27/2019   CL 106 08/27/2019   CO2 23 08/27/2019   Lab Results  Component Value Date   ALT 21 08/27/2019   AST 25 08/27/2019   ALKPHOS 87 08/27/2019   BILITOT 0.4 08/27/2019   Lab Results  Component Value Date   HGBA1C 6.6 (H) 08/27/2019   Lab Results  Component Value Date   INSULIN 52.8 (H) 08/27/2019   Lab Results  Component Value Date   TSH 2.560 08/27/2019   Lab Results  Component Value Date   CHOL 183 08/27/2019   HDL 37 (L) 08/27/2019   LDLCALC 122 (H) 08/27/2019   TRIG 133 08/27/2019   CHOLHDL 4.9 (H) 08/27/2019   Lab Results  Component Value Date   WBC 9.2 08/27/2019   HGB 12.7 08/27/2019   HCT 40.1 08/27/2019   MCV 85 08/27/2019   PLT 219 08/27/2019   Lab Results  Component Value Date   IRON 85 08/27/2019   TIBC 361 08/27/2019   FERRITIN 38 08/27/2019   Attestation Statements:   Reviewed by clinician on day of visit: allergies, medications, problem list, medical history, surgical history, family history, social history, and previous encounter notes.  I, Water quality scientist, CMA, am acting as transcriptionist for Briscoe Deutscher, DO  I have reviewed the above documentation for accuracy and completeness, and I agree with the above. Briscoe Deutscher, DO

## 2019-10-31 ENCOUNTER — Encounter (INDEPENDENT_AMBULATORY_CARE_PROVIDER_SITE_OTHER): Payer: Self-pay

## 2019-11-05 ENCOUNTER — Other Ambulatory Visit: Payer: Self-pay

## 2019-11-05 ENCOUNTER — Encounter (INDEPENDENT_AMBULATORY_CARE_PROVIDER_SITE_OTHER): Payer: Self-pay | Admitting: Family Medicine

## 2019-11-05 ENCOUNTER — Ambulatory Visit (INDEPENDENT_AMBULATORY_CARE_PROVIDER_SITE_OTHER): Payer: Medicaid Other | Admitting: Family Medicine

## 2019-11-05 VITALS — BP 130/83 | HR 103 | Temp 98.0°F | Ht 71.0 in | Wt 296.0 lb

## 2019-11-05 DIAGNOSIS — E119 Type 2 diabetes mellitus without complications: Secondary | ICD-10-CM

## 2019-11-05 DIAGNOSIS — Z6841 Body Mass Index (BMI) 40.0 and over, adult: Secondary | ICD-10-CM | POA: Diagnosis not present

## 2019-11-06 NOTE — Progress Notes (Signed)
Chief Complaint:   OBESITY [name] is here to discuss her progress with her obesity treatment plan along with follow-up of her obesity related diagnoses. [name] is on the Category 4 Plan and states she is following her eating plan approximately 50% of the time. [name] states she is walking for 30 minutes 3 times per week.  Today's visit was #: 4 Starting weight: 301 lbs Starting date: 08/27/2019 Today's weight: 296 lbs Today's date: 11/05/2019 Total lbs lost to date: 5 lbs Total lbs lost since last in-office visit: 0  Interim History: [name] has been off track and says she has been eating more sweets.  She reports noticing an increase in inflammation and pain.  She is ready to get back to the plan.  Subjective:   1. Type 2 diabetes mellitus without complication, without long-term current use of insulin (HCC) Medications reviewed. Diabetic ROS: no polyuria or polydipsia, no chest pain, dyspnea or TIA's, no numbness, tingling or pain in extremities.  She is taking metformin 500 mg daily.  Lab Results  Component Value Date   HGBA1C 6.6 (H) 08/27/2019   Lab Results  Component Value Date   LDLCALC 122 (H) 08/27/2019   CREATININE 0.69 08/27/2019   Lab Results  Component Value Date   INSULIN 52.8 (H) 08/27/2019   Assessment/Plan:   1. Type 2 diabetes mellitus without complication, without long-term current use of insulin (HCC) Good blood sugar control is important to decrease the likelihood of diabetic complications such as nephropathy, neuropathy, limb loss, blindness, coronary artery disease, and death. Intensive lifestyle modification including diet, exercise and weight loss are the first line of treatment for diabetes.   2. Class 3 severe obesity with serious comorbidity and body mass index (BMI) of 40.0 to 44.9 in adult, unspecified obesity type [facility]) [name] is currently in the action stage of change. As such, her goal is to continue with weight loss efforts. She has agreed to the  Category 4 Plan.   Exercise goals: For substantial health benefits, adults should do at least 150 minutes (2 hours and 30 minutes) a week of moderate-intensity, or 75 minutes (1 hour and 15 minutes) a week of vigorous-intensity aerobic physical activity, or an equivalent combination of moderate- and vigorous-intensity aerobic activity. Aerobic activity should be performed in episodes of at least 10 minutes, and preferably, it should be spread throughout the week.  Behavioral modification strategies: increasing lean protein intake.  [name] has agreed to follow-up with our clinic in 3-4 weeks. She was informed of the importance of frequent follow-up visits to maximize her success with intensive lifestyle modifications for her multiple health conditions.   Objective:   Blood pressure 130/83, pulse (!) 103, temperature 98 F (36.7 C), temperature source Oral, height 5\' 11"  (1.803 m), weight 296 lb (134.3 kg), SpO2 96 %. Body mass index is 41.28 kg/m.  General: Cooperative, alert, well developed, in no acute distress. HEENT: Conjunctivae and lids unremarkable. Cardiovascular: Regular rhythm.  Lungs: Normal work of breathing. Neurologic: No focal deficits.   Lab Results  Component Value Date   CREATININE 0.69 08/27/2019   BUN 10 08/27/2019   NA 141 08/27/2019   K 4.4 08/27/2019   CL 106 08/27/2019   CO2 23 08/27/2019   Lab Results  Component Value Date   ALT 21 08/27/2019   AST 25 08/27/2019   ALKPHOS 87 08/27/2019   BILITOT 0.4 08/27/2019   Lab Results  Component Value Date   HGBA1C 6.6 (H) 08/27/2019  Lab Results  Component Value Date   INSULIN 52.8 (H) 08/27/2019   Lab Results  Component Value Date   TSH 2.560 08/27/2019   Lab Results  Component Value Date   CHOL 183 08/27/2019   HDL 37 (L) 08/27/2019   LDLCALC 122 (H) 08/27/2019   TRIG 133 08/27/2019   CHOLHDL 4.9 (H) 08/27/2019   Lab Results  Component Value Date   WBC 9.2 08/27/2019   HGB 12.7 08/27/2019    HCT 40.1 08/27/2019   MCV 85 08/27/2019   PLT 219 08/27/2019   Lab Results  Component Value Date   IRON 85 08/27/2019   TIBC 361 08/27/2019   FERRITIN 38 08/27/2019   Attestation Statements:   Reviewed by clinician on day of visit: allergies, medications, problem list, medical history, surgical history, family history, social history, and previous encounter notes.  Time spent on visit including pre-visit chart review and post-visit care and charting was 25 minutes.   I, Insurance claims handler, CMA, am acting as transcriptionist for Helane Rima, DO  I have reviewed the above documentation for accuracy and completeness, and I agree with the above. Helane Rima, DO

## 2019-12-03 ENCOUNTER — Ambulatory Visit (INDEPENDENT_AMBULATORY_CARE_PROVIDER_SITE_OTHER): Payer: Medicaid Other | Admitting: Family Medicine

## 2019-12-18 ENCOUNTER — Ambulatory Visit (INDEPENDENT_AMBULATORY_CARE_PROVIDER_SITE_OTHER): Payer: Medicaid Other | Admitting: Family Medicine

## 2019-12-31 ENCOUNTER — Encounter (INDEPENDENT_AMBULATORY_CARE_PROVIDER_SITE_OTHER): Payer: Self-pay | Admitting: Family Medicine

## 2021-03-01 ENCOUNTER — Other Ambulatory Visit: Payer: Self-pay

## 2021-03-01 ENCOUNTER — Other Ambulatory Visit: Payer: Self-pay | Admitting: Physician Assistant

## 2021-03-01 DIAGNOSIS — Z1231 Encounter for screening mammogram for malignant neoplasm of breast: Secondary | ICD-10-CM

## 2021-04-06 ENCOUNTER — Ambulatory Visit
Admission: RE | Admit: 2021-04-06 | Discharge: 2021-04-06 | Disposition: A | Discharge status: Home / Self Care | Payer: Medicaid Other | Source: Ambulatory Visit | Attending: Physician Assistant | Admitting: Physician Assistant

## 2021-04-06 DIAGNOSIS — Z1231 Encounter for screening mammogram for malignant neoplasm of breast: Secondary | ICD-10-CM

## 2021-04-08 ENCOUNTER — Other Ambulatory Visit: Payer: Self-pay | Admitting: Physician Assistant

## 2021-04-08 DIAGNOSIS — R928 Other abnormal and inconclusive findings on diagnostic imaging of breast: Secondary | ICD-10-CM

## 2021-05-19 ENCOUNTER — Ambulatory Visit
Admission: RE | Admit: 2021-05-19 | Discharge: 2021-05-19 | Disposition: A | Discharge status: Home / Self Care | Payer: Medicaid Other | Source: Ambulatory Visit | Attending: Physician Assistant | Admitting: Physician Assistant

## 2021-05-19 ENCOUNTER — Other Ambulatory Visit: Payer: Self-pay

## 2021-05-19 DIAGNOSIS — R928 Other abnormal and inconclusive findings on diagnostic imaging of breast: Secondary | ICD-10-CM

## 2021-12-08 ENCOUNTER — Encounter (INDEPENDENT_AMBULATORY_CARE_PROVIDER_SITE_OTHER): Payer: Self-pay

## 2022-01-19 ENCOUNTER — Ambulatory Visit: Payer: Medicaid Other | Admitting: Adult Health

## 2022-01-19 ENCOUNTER — Encounter: Payer: Self-pay | Admitting: Adult Health

## 2022-01-19 VITALS — BP 150/95 | HR 104 | Ht 71.0 in | Wt 299.0 lb

## 2022-01-19 DIAGNOSIS — Z131 Encounter for screening for diabetes mellitus: Secondary | ICD-10-CM | POA: Insufficient documentation

## 2022-01-19 DIAGNOSIS — Z3202 Encounter for pregnancy test, result negative: Secondary | ICD-10-CM

## 2022-01-19 DIAGNOSIS — N926 Irregular menstruation, unspecified: Secondary | ICD-10-CM

## 2022-01-19 DIAGNOSIS — N921 Excessive and frequent menstruation with irregular cycle: Secondary | ICD-10-CM | POA: Insufficient documentation

## 2022-01-19 LAB — POCT URINE PREGNANCY: Preg Test, Ur: NEGATIVE

## 2022-01-19 NOTE — Progress Notes (Signed)
Patient ID: [name], female   DOB: December 14, 1980, 41 y.o.   MRN: [id number] History of Present Illness: [name] is a 41 year old white female, married, [id number] in for bleeding for last 3 months, periods have always been irregular. She started megace last Thursday and bleeding stopped in about a day, can't take COCs BP goes up. She changes pads on heavy days about every 1.5 hours and has large clots, has back pain with periods.   Last pap was normal 07/2021 at [facility] in [place].  Referred by Arlis Porta PA.  Current Medications, Allergies, Past Medical History, Past Surgical History, Family History and Social History were reviewed in [employer] record.     Review of Systems: + bleeding for last 3 months, periods have always been irregular. She started megace last Thursday and bleeding stopped in about a day, can't take COCs BP goes up. She changes pads on heavy days about every 1.5 hours and has large clots, has back pain with periods.     Physical Exam:BP (!) 150/95 (BP Location: Left Arm, Patient Position: Sitting, Cuff Size: Large)   Pulse (!) 104   Ht 5\' 11"  (1.803 m)   Wt 299 lb (135.6 kg)   BMI 41.70 kg/m  UPT is negative  General:  Well developed, well nourished, no acute distress Skin:  Warm and dry Lungs; Clear to auscultation bilaterally Cardiovascular: Regular rate and rhythm Pelvic:  External genitalia is normal in appearance, no lesions.  The vagina is normal in appearance. Urethra has no lesions or masses. The cervix is bulbous.  Uterus is felt to be normal size, shape, and contour.  No adnexal masses or tenderness noted.Bladder is non tender, no masses felt. Extremities/musculoskeletal:  No swelling or varicosities noted, no clubbing or cyanosis Psych:  No mood changes, alert and cooperative,seems happy AA is 0 Fall risk is low    01/19/2022   10:51 AM 08/27/2019   10:20 AM  Depression screen PHQ 2/9  Decreased Interest 1 3  Down,  Depressed, Hopeless 1 3  PHQ - 2 Score 2 6  Altered sleeping 0 2  Tired, decreased energy 1 3  Change in appetite 0 3  Feeling bad or failure about yourself  0 3  Trouble concentrating 0 0  Moving slowly or fidgety/restless 0 1  Suicidal thoughts 0 0  PHQ-9 Score 3 18  Difficult doing work/chores  Somewhat difficult       01/19/2022   10:52 AM  GAD 7 : Generalized Anxiety Score  Nervous, Anxious, on Edge 1  Control/stop worrying 1  Worry too much - different things 1  Trouble relaxing 1  Restless 0  Easily annoyed or irritable 1  Afraid - awful might happen 1  Total GAD 7 Score 6      Upstream - 01/19/22 1109       Pregnancy Intention Screening   Does the patient want to become pregnant in the next year? No    Does the patient's partner want to become pregnant in the next year? No    Would the patient like to discuss contraceptive options today? No      Contraception Wrap Up   Current Method Female Sterilization    End Method Female Sterilization             Examination chaperoned by Levy Pupa LPN  Impression and Plan: 1. Pregnancy examination or test, negative result - POCT urine pregnancy  2. Menometrorrhagia Bled for  3 months, it stopped with megace Will get Pelvic US 01/21/22 at [facility] at 1:30 pm to assess uterus and ovaries Will check labs - US PELVIC COMPLETE WITH TRANSVAGINAL; Future - CBC - Comprehensive metabolic panel - TSH  3. Screening for diabetes mellitus - Hemoglobin A1c  4. Menstrual periods irregular Discussed POPs, IUD or endometrial ablation has possible options Handout given on endometrial ablation

## 2022-01-20 ENCOUNTER — Telehealth: Payer: Self-pay | Admitting: Adult Health

## 2022-01-20 ENCOUNTER — Encounter: Payer: Self-pay | Admitting: Adult Health

## 2022-01-20 DIAGNOSIS — D649 Anemia, unspecified: Secondary | ICD-10-CM

## 2022-01-20 DIAGNOSIS — E119 Type 2 diabetes mellitus without complications: Secondary | ICD-10-CM

## 2022-01-20 DIAGNOSIS — D500 Iron deficiency anemia secondary to blood loss (chronic): Secondary | ICD-10-CM

## 2022-01-20 HISTORY — DX: Type 2 diabetes mellitus without complications: E11.9

## 2022-01-20 HISTORY — DX: Anemia, unspecified: D64.9

## 2022-01-20 LAB — CBC
Hematocrit: 29.4 % — ABNORMAL LOW (ref 34.0–46.6)
Hemoglobin: 8.8 g/dL — ABNORMAL LOW (ref 11.1–15.9)
MCH: 22.2 pg — ABNORMAL LOW (ref 26.6–33.0)
MCHC: 29.9 g/dL — ABNORMAL LOW (ref 31.5–35.7)
MCV: 74 fL — ABNORMAL LOW (ref 79–97)
Platelets: 329 10*3/uL (ref 150–450)
RBC: 3.96 x10E6/uL (ref 3.77–5.28)
RDW: 19.7 % — ABNORMAL HIGH (ref 11.7–15.4)
WBC: 8.6 10*3/uL (ref 3.4–10.8)

## 2022-01-20 LAB — COMPREHENSIVE METABOLIC PANEL
ALT: 23 IU/L (ref 0–32)
AST: 24 IU/L (ref 0–40)
Albumin/Globulin Ratio: 1.7 (ref 1.2–2.2)
Albumin: 4.2 g/dL (ref 3.9–4.9)
Alkaline Phosphatase: 83 IU/L (ref 44–121)
BUN/Creatinine Ratio: 11 (ref 9–23)
BUN: 7 mg/dL (ref 6–24)
Bilirubin Total: 0.3 mg/dL (ref 0.0–1.2)
CO2: 20 mmol/L (ref 20–29)
Calcium: 9.3 mg/dL (ref 8.7–10.2)
Chloride: 103 mmol/L (ref 96–106)
Creatinine, Ser: 0.66 mg/dL (ref 0.57–1.00)
Globulin, Total: 2.5 g/dL (ref 1.5–4.5)
Glucose: 142 mg/dL — ABNORMAL HIGH (ref 70–99)
Potassium: 4 mmol/L (ref 3.5–5.2)
Sodium: 140 mmol/L (ref 134–144)
Total Protein: 6.7 g/dL (ref 6.0–8.5)
eGFR: 114 mL/min/{1.73_m2} (ref >59)

## 2022-01-20 LAB — HEMOGLOBIN A1C
Est. average glucose Bld gHb Est-mCnc: 154 mg/dL
Hgb A1c MFr Bld: 7 % — ABNORMAL HIGH (ref 4.8–5.6)

## 2022-01-20 LAB — TSH: TSH: 1.82 u[IU]/mL (ref 0.450–4.500)

## 2022-01-20 NOTE — Telephone Encounter (Signed)
Pt aware of labs with will take OTC iron,HGB 8.8, and start lifestyle modifications for A1c 7, and call PCP

## 2022-01-21 ENCOUNTER — Ambulatory Visit (HOSPITAL_COMMUNITY)
Admission: RE | Admit: 2022-01-21 | Discharge: 2022-01-21 | Disposition: A | Discharge status: Home / Self Care | Payer: Medicaid Other | Source: Ambulatory Visit | Attending: Adult Health | Admitting: Adult Health

## 2022-01-21 DIAGNOSIS — N921 Excessive and frequent menstruation with irregular cycle: Secondary | ICD-10-CM | POA: Diagnosis present

## 2022-01-24 ENCOUNTER — Telehealth: Payer: Self-pay | Admitting: Adult Health

## 2022-01-24 NOTE — Telephone Encounter (Signed)
Pt aware US showed 7.8 cm ovarian cyst on the left, will get follow up US in about 2 months

## 2022-02-02 ENCOUNTER — Encounter: Payer: Self-pay | Admitting: Adult Health

## 2022-02-02 ENCOUNTER — Ambulatory Visit: Payer: Medicaid Other | Admitting: Adult Health

## 2022-02-02 VITALS — BP 142/96 | HR 97 | Ht 71.0 in | Wt 298.0 lb

## 2022-02-02 DIAGNOSIS — N83202 Unspecified ovarian cyst, left side: Secondary | ICD-10-CM

## 2022-02-02 DIAGNOSIS — D500 Iron deficiency anemia secondary to blood loss (chronic): Secondary | ICD-10-CM | POA: Diagnosis not present

## 2022-02-02 DIAGNOSIS — N921 Excessive and frequent menstruation with irregular cycle: Secondary | ICD-10-CM | POA: Diagnosis not present

## 2022-02-02 DIAGNOSIS — E119 Type 2 diabetes mellitus without complications: Secondary | ICD-10-CM | POA: Diagnosis not present

## 2022-02-02 MED ORDER — METFORMIN HCL 500 MG PO TABS: 500.0000 mg | ORAL_TABLET | Freq: Two times a day (BID) | ORAL | 3 refills | Status: DC

## 2022-02-02 NOTE — Progress Notes (Signed)
  Subjective:     Patient ID: [name], female   DOB: Sep 19, 1980, 41 y.o.   MRN: [id number]  HPI [name] is a 41 year old white female,married, [id number], back in follow up on bleeding and has not had any more since taking megace.  She had labs 01/19/22 A1c was 7, HGB was 8.8. thyroid was normal. She had pelvic US 01/21/22.  PCP is Particia Nearing PA.  Review of Systems Feels cold and tired, no energy No bleeding  Reviewed past medical,surgical, social and family history. Reviewed medications and allergies.     Objective:   Physical Exam BP (!) 142/96 (BP Location: Left Arm, Patient Position: Sitting, Cuff Size: Large)   Pulse 97   Ht 5\' 11"  (1.803 m)   Wt 298 lb (135.2 kg)   BMI 41.56 kg/m     Skin warm and dry.  Lungs: clear to ausculation bilaterally. Cardiovascular: regular rate and rhythm.   Reviewed [place]: IMPRESSION: Uterus is retroverted. Endometrial stripe is prominent measuring 2.9 cm. There is no significant increased vascularity in the endometrium. Findings may suggest secretory phase of menstrual cycle or endometrial hyperplasia. Less likely possibility would be neoplastic process. Short-term follow-up sonogram in 2 months may be considered.   There is 7.8 cm complex cystic structure in the left adnexa. This may suggest functional ovarian cyst. Follow-up sonogram in 2 months should be considered to assess resolution.    Upstream - 02/02/22 1109       Pregnancy Intention Screening   Does the patient want to become pregnant in the next year? No    Does the patient's partner want to become pregnant in the next year? No    Would the patient like to discuss contraceptive options today? No      Contraception Wrap Up   Current Method Female Sterilization    End Method Female Sterilization             Assessment:     1. Iron deficiency anemia due to chronic blood loss +cold and no energy, tired Check CBC and iron panel Take OTC iron  2. Type 2 diabetes  mellitus without complication, without long-term current use of insulin (HCC) Will rx metformin Has decreased carbs and walking  Meds ordered this encounter  Medications   metFORMIN (GLUCOPHAGE) 500 MG tablet    Sig: Take 1 tablet (500 mg total) by mouth 2 (two) times daily with a meal.    Dispense:  60 tablet    Refill:  3    Order Specific Question:   Supervising Provider    Answer:   Tania Ade H [[id number]]   Check A1c in 3 months   3. Left ovarian cyst [place] in 6 weeks to reassess for resolution  4. Menometrorrhagia Bleeding has stopped but will repeat US in 6 weeks to see if endometrial strip still thickened      Plan:     Follow up in 6 weeks for GYN [place] and see me after

## 2022-02-03 LAB — CBC
Hematocrit: 32.4 % — ABNORMAL LOW (ref 34.0–46.6)
Hemoglobin: 9.5 g/dL — ABNORMAL LOW (ref 11.1–15.9)
MCH: 22.1 pg — ABNORMAL LOW (ref 26.6–33.0)
MCHC: 29.3 g/dL — ABNORMAL LOW (ref 31.5–35.7)
MCV: 75 fL — ABNORMAL LOW (ref 79–97)
Platelets: 404 10*3/uL (ref 150–450)
RBC: 4.3 x10E6/uL (ref 3.77–5.28)
RDW: 20.3 % — ABNORMAL HIGH (ref 11.7–15.4)
WBC: 8.1 10*3/uL (ref 3.4–10.8)

## 2022-02-03 LAB — IRON,TIBC AND FERRITIN PANEL
Ferritin: 18 ng/mL (ref 15–150)
Iron Saturation: 14 % — ABNORMAL LOW (ref 15–55)
Iron: 50 ug/dL (ref 27–159)
Total Iron Binding Capacity: 370 ug/dL (ref 250–450)
UIBC: 320 ug/dL (ref 131–425)

## 2022-02-10 ENCOUNTER — Other Ambulatory Visit: Payer: Self-pay | Admitting: Adult Health

## 2022-02-10 MED ORDER — MEGESTROL ACETATE 40 MG PO TABS: ORAL_TABLET | ORAL | 1 refills | Status: DC

## 2022-02-10 NOTE — Progress Notes (Signed)
Refilled megace  

## 2022-03-02 ENCOUNTER — Telehealth: Payer: Self-pay | Admitting: Adult Health

## 2022-03-02 ENCOUNTER — Ambulatory Visit (HOSPITAL_BASED_OUTPATIENT_CLINIC_OR_DEPARTMENT_OTHER)
Admission: RE | Admit: 2022-03-02 | Discharge: 2022-03-02 | Disposition: A | Discharge status: Home / Self Care | Payer: Medicaid Other | Source: Ambulatory Visit | Attending: Adult Health | Admitting: Adult Health

## 2022-03-02 DIAGNOSIS — N921 Excessive and frequent menstruation with irregular cycle: Secondary | ICD-10-CM | POA: Insufficient documentation

## 2022-03-02 DIAGNOSIS — N83202 Unspecified ovarian cyst, left side: Secondary | ICD-10-CM | POA: Insufficient documentation

## 2022-03-02 NOTE — Telephone Encounter (Signed)
Pt aware of [place], cyst still there and irregular with septation, and ECC still thickened, will get appt with Dr Nelda Marseille to discuss removal of cyst and ovary and hysterectomy at that time.

## 2022-03-04 ENCOUNTER — Ambulatory Visit: Payer: Medicaid Other | Admitting: Obstetrics & Gynecology

## 2022-03-04 ENCOUNTER — Encounter: Payer: Self-pay | Admitting: Obstetrics & Gynecology

## 2022-03-04 VITALS — BP 128/87 | HR 99 | Wt 283.4 lb

## 2022-03-04 DIAGNOSIS — N83202 Unspecified ovarian cyst, left side: Secondary | ICD-10-CM

## 2022-03-04 DIAGNOSIS — E669 Obesity, unspecified: Secondary | ICD-10-CM | POA: Diagnosis not present

## 2022-03-04 DIAGNOSIS — N939 Abnormal uterine and vaginal bleeding, unspecified: Secondary | ICD-10-CM | POA: Diagnosis not present

## 2022-03-04 DIAGNOSIS — Z98891 History of uterine scar from previous surgery: Secondary | ICD-10-CM | POA: Diagnosis not present

## 2022-03-04 DIAGNOSIS — Z9851 Tubal ligation status: Secondary | ICD-10-CM

## 2022-03-04 NOTE — Progress Notes (Signed)
GYN VISIT Patient name: [name] MRN [id number]  Date of birth: 1981-03-17 Chief Complaint:   Pre-op Exam (Discuss surgery)  History of Present Illness:   [name] is a 41 y.o. [phone] female being seen today for the following concerns:     AUB: Periods have been irregular for many years.  Previously on OCPs, but noted HTN.  Sometimes menses will be normal, other times HMB.  Menses will typically last 7-8 days.  Then last year in April- menses lasted x 2 mos.  Then in June, started bleeding until seen by PCP  (2-3 mos) and started on Megace.  While on the Megace, she does not have a period, but feels like her body is "trying" to have a period.  Notes back pain anytime she has her period and feels like that more often.  Every now and then will take ibuprofen.  A few days ago, she had horrible radiating pain and bloating.  Changed diets to liquids only and then seemed to resolve on its own.  Incidental left ovarian cyst: As part of her work up for AUB, pelvic US completed:  Recent US 03/02/2022:  11.5 x 7.5 x 8.9 cm = volume: 400 mL. Anteverted. Heterogeneous myometrium. Scattered shadowing. Asymmetric thickening of posterior wall. Findings suggest adenomyosis. No discrete mass.  Complex cystic lesion LEFT ovary 6.6 cm diameter, containing several septations and an area of wall irregularity/thickening, indeterminate, ovarian malignancy not excluded; surgical evaluation recommended.  Prior left complex cyst measured 7.8cm on [place] in Sept 2023  Contraception: BTL Prior C-section x 3  No LMP recorded. (Menstrual status: Irregular Periods).     01/19/2022   10:51 AM 08/27/2019   10:20 AM  Depression screen PHQ 2/9  Decreased Interest 1 3  Down, Depressed, Hopeless 1 3  PHQ - 2 Score 2 6  Altered sleeping 0 2  Tired, decreased energy 1 3  Change in appetite 0 3  Feeling bad or failure about yourself  0 3  Trouble concentrating 0 0  Moving slowly or fidgety/restless  0 1  Suicidal thoughts 0 0  PHQ-9 Score 3 18  Difficult doing work/chores  Somewhat difficult     Review of Systems:   Pertinent items are noted in HPI Denies fever/chills, dizziness, headaches, visual disturbances, fatigue, shortness of breath, chest pain, abdominal pain, vomiting. Pertinent History Reviewed:  Reviewed past medical,surgical, social, obstetrical and family history.  Reviewed problem list, medications and allergies. Physical Assessment:   Vitals:   03/04/22 1247  BP: 128/87  Pulse: 99  Weight: 283 lb 6.4 oz (128.5 kg)  Body mass index is 39.53 kg/m.       Physical Examination:   General appearance: alert, well appearing, and in no distress  Psych: mood appropriate, normal affect  Skin: warm & dry   Cardiovascular: normal heart rate noted  Respiratory: normal respiratory effort, no distress  Abdomen: obese, soft, non-tender   Pelvic: VULVA: normal appearing vulva with no masses, tenderness or lesions, VAGINA: normal appearing vagina with normal color and discharge, no lesions, CERVIX: normal appearing cervix without discharge or lesions, UTERUS: uterus is normal size, shape, consistency and nontender, ADNEXA: normal adnexa in size, nontender and no masses  Extremities: no edema   Chaperone:  pt declined     Assessment & Plan:  1) AUB -discussed conservative treatment from continued Megace to alternative medications to surgical intervention such as endometrial ablation or hysterectomy -risk/benefit of each option reviewed  2) Ovarian  cyst -findings favor benign etiology; however due to irregular appearing border recommend surgical intervention -discussed risk/benefit and alternatives to surgery -ROMA today  -Discussed surgery plan for TAH, Left BSO and right salpingectomy -reviewed same day surgery and recovery expectatons -also discussed robotic hyst in [place] -Questions/concerns were addressed  []  pt call and let know her decision   Orders Placed  This Encounter  Procedures   Ovarian Malignancy Risk-ROMA    Return for TBD.   [place], DO Attending Obstetrician & Gynecologist, [facility] for [facility]

## 2022-03-06 LAB — OVARIAN MALIGNANCY RISK-ROMA
Cancer Antigen (CA) 125: 59.8 U/mL — ABNORMAL HIGH (ref 0.0–38.1)
HE4: 43.7 pmol/L (ref 0.0–63.6)
Postmenopausal ROMA: 2.37
Premenopausal ROMA: 0.6

## 2022-03-06 LAB — PREMENOPAUSAL INTERP: LOW

## 2022-03-06 LAB — POSTMENOPAUSAL INTERP: LOW

## 2022-03-07 ENCOUNTER — Encounter: Payer: Self-pay | Admitting: Obstetrics & Gynecology

## 2022-03-09 ENCOUNTER — Encounter: Payer: Self-pay | Admitting: Obstetrics & Gynecology

## 2022-03-16 ENCOUNTER — Ambulatory Visit: Payer: Medicaid Other | Admitting: Adult Health

## 2022-03-16 ENCOUNTER — Other Ambulatory Visit: Payer: Medicaid Other

## 2022-04-07 NOTE — Patient Instructions (Signed)
[name]  04/07/2022     @   Your procedure is scheduled on  04/13/2022.   Report to [facility] at  [phone]  A.M.   Call this number if you have problems the morning of surgery:  [phone]  If you experience any cold or flu symptoms such as cough, fever, chills, shortness of breath, etc. between now and your scheduled surgery, please notify us at the above number.   Remember:  Do not eat or drink after midnight.          DO NOT take any medications for diabetes the morning of your procedure.     Take these medicines the morning of surgery with A SIP OF WATER                            zyrtec, omeprazole.     Do not wear jewelry, make-up or nail polish.  Do not wear lotions, powders, or perfumes, or deodorant.  Do not shave 48 hours prior to surgery.  Men may shave face and neck.  Do not bring valuables to the hospital.  [facility] is not responsible for any belongings or valuables.  Contacts, dentures or bridgework may not be worn into surgery.  Leave your suitcase in the car.  After surgery it may be brought to your room.  For patients admitted to the hospital, discharge time will be determined by your treatment team.  Patients discharged the day of surgery will not be allowed to drive home and must have someone with them for 24 hours.    Special instructions:   DO NOT smoke tobacco or vape for 24 hours before your procedure.  Please read over the following fact sheets that you were given. Pain Booklet, Coughing and Deep Breathing, Blood Transfusion Information, Surgical Site Infection Prevention, Anesthesia Post-op Instructions, and Care and Recovery After Surgery      Abdominal Hysterectomy, Care After The following information offers guidance on how to care for yourself after your procedure. Your doctor may also give you more specific instructions. If you have problems or questions, contact your doctor. What can I expect  after the procedure? After the procedure, it is common to have: Pain. Tiredness. No desire to eat. Less interest in sex. Bleeding and fluid (discharge) from your vagina. You may need to use a pad after this procedure. Trouble pooping (constipation). Feelings of sadness or other emotions. Follow these instructions at home: Medicines Take over-the-counter and prescription medicines only as told by your doctor. If you were prescribed an antibiotic medicine, take it as told by your doctor. Do not stop using the antibiotic even if you start to feel better. If told, take steps to prevent problems with pooping (constipation). You may need to: Drink enough fluid to keep your pee (urine) pale yellow. Take medicines. You will be told what medicines to take. Eat foods that are high in fiber. These include beans, whole grains, and fresh fruits and vegetables. Limit foods that are high in fat and sugar. These include fried or sweet foods. Ask your doctor if you should avoid driving or using machines while you are taking your medicine. Surgical cut care  Follow instructions from your doctor about how to take care of your cut from surgery (incision). Make sure you: Wash your hands with soap and water for at least 20 seconds before and after you change your  bandage. If you cannot use soap and water, use hand sanitizer. Change your bandage. Leave stitches or skin glue in place for at least two weeks. Leave tape strips alone unless you are told to take them off. You may trim the edges of the tape strips if they curl up. Keep the bandage dry until your doctor says it can be taken off. Check your incision every day for signs of infection. Check for: More redness, swelling, or pain. Fluid or blood. Warmth. Pus or a bad smell. Activity  Rest as told by your doctor. Get up to take short walks every 1 to 2 hours. Ask for help if you feel weak or unsteady. Do not lift anything that is heavier than 10 lb  (4.5 kg), or the limit that you are told. Follow your doctor's advice about exercise, driving, and general activities. Return to your normal activities when your doctor says that it is safe. Lifestyle Do not douche, use tampons, or have sex for at least 6 weeks or as told by your doctor. Do not drink alcohol until your doctor says it is okay. Do not smoke or use any products that contain nicotine or tobacco. These can delay healing after surgery. If you need help quitting, ask your doctor. General instructions Do not take baths, swim, or use a hot tub. Ask your doctor about taking showers or sponge baths. Try to have a responsible adult at home with you for the first 1-2 weeks to help with your daily chores. Wear tight-fitting (compression) stockings as told by your doctor. Keep all follow-up visits. Contact a doctor if: You have chills or a fever. You have any of these signs of infection around your cut: More redness, swelling, or pain. Fluid or blood. Warmth. Pus or a bad smell. Your cut breaks open. You feel dizzy or light-headed. You have pain or bleeding when you pee. You keep having watery poop (diarrhea). You keep feeling like you may vomit or you keep vomiting. You have fluid coming from your vagina that is not normal. You have any type of reaction to your medicine that is not normal, like a rash, or you develop an allergy to your medicine. Your pain medicine does not help. Get help right away if: You have a fever and your symptoms get worse suddenly. You have very bad pain in your belly (abdomen). You are short of breath. You faint. You have pain, swelling, or redness of your leg. You bleed a lot from your vagina and you see blood clots. Summary It is normal to have some pain, tiredness, and fluid that comes from your vagina. Do not take baths, swim, or use a hot tub. Ask your doctor about taking showers or sponge baths. Do not lift anything that is heavier than 10 lb  (4.5 kg), or the limit that you are told. Follow your doctor's advice about exercise, driving, and general activities. Try to have a responsible adult at home with you for the first 1-2 weeks to help with your daily chores. This information is not intended to replace advice given to you by your health care provider. Make sure you discuss any questions you have with your health care provider. Document Revised: 06/26/2020 Document Reviewed: 12/19/2019 Elsevier Patient Education  2023 [employer] General Anesthesia, Adult, Care After The following information offers guidance on how to care for yourself after your procedure. Your health care provider may also give you more specific instructions. If you have problems or questions, contact your  health care provider. What can I expect after the procedure? After the procedure, it is common for people to: Have pain or discomfort at the IV site. Have nausea or vomiting. Have a sore throat or hoarseness. Have trouble concentrating. Feel cold or chills. Feel weak, sleepy, or tired (fatigue). Have soreness and body aches. These can affect parts of the body that were not involved in surgery. Follow these instructions at home: For the time period you were told by your health care provider:  Rest. Do not participate in activities where you could fall or become injured. Do not drive or use machinery. Do not drink alcohol. Do not take sleeping pills or medicines that cause drowsiness. Do not make important decisions or sign legal documents. Do not take care of children on your own. General instructions Drink enough fluid to keep your urine pale yellow. If you have sleep apnea, surgery and certain medicines can increase your risk for breathing problems. Follow instructions from your health care provider about wearing your sleep device: Anytime you are sleeping, including during daytime naps. While taking prescription pain medicines, sleeping  medicines, or medicines that make you drowsy. Return to your normal activities as told by your health care provider. Ask your health care provider what activities are safe for you. Take over-the-counter and prescription medicines only as told by your health care provider. Do not use any products that contain nicotine or tobacco. These products include cigarettes, chewing tobacco, and vaping devices, such as e-cigarettes. These can delay incision healing after surgery. If you need help quitting, ask your health care provider. Contact a health care provider if: You have nausea or vomiting that does not get better with medicine. You vomit every time you eat or drink. You have pain that does not get better with medicine. You cannot urinate or have bloody urine. You develop a skin rash. You have a fever. Get help right away if: You have trouble breathing. You have chest pain. You vomit blood. These symptoms may be an emergency. Get help right away. Call 911. Do not wait to see if the symptoms will go away. Do not drive yourself to the hospital. Summary After the procedure, it is common to have a sore throat, hoarseness, nausea, vomiting, or to feel weak, sleepy, or fatigue. For the time period you were told by your health care provider, do not drive or use machinery. Get help right away if you have difficulty breathing, have chest pain, or vomit blood. These symptoms may be an emergency. This information is not intended to replace advice given to you by your health care provider. Make sure you discuss any questions you have with your health care provider. Document Revised: 07/16/2021 Document Reviewed: 07/16/2021 Elsevier Patient Education  2023 [employer]. How to Use Chlorhexidine Before Surgery Chlorhexidine gluconate (CHG) is a germ-killing (antiseptic) solution that is used to clean the skin. It can get rid of the bacteria that normally live on the skin and can keep them away for about 24  hours. To clean your skin with CHG, you may be given: A CHG solution to use in the shower or as part of a sponge bath. A prepackaged cloth that contains CHG. Cleaning your skin with CHG may help lower the risk for infection: While you are staying in the intensive care unit of the hospital. If you have a vascular access, such as a central line, to provide short-term or long-term access to your veins. If you have a catheter to drain  urine from your bladder. If you are on a ventilator. A ventilator is a machine that helps you breathe by moving air in and out of your lungs. After surgery. What are the risks? Risks of using CHG include: A skin reaction. Hearing loss, if CHG gets in your ears and you have a perforated eardrum. Eye injury, if CHG gets in your eyes and is not rinsed out. The CHG product catching fire. Make sure that you avoid smoking and flames after applying CHG to your skin. Do not use CHG: If you have a chlorhexidine allergy or have previously reacted to chlorhexidine. On babies younger than 27 months of age. How to use CHG solution Use CHG only as told by your health care provider, and follow the instructions on the label. Use the full amount of CHG as directed. Usually, this is one bottle. During a shower Follow these steps when using CHG solution during a shower (unless your health care provider gives you different instructions): Start the shower. Use your normal soap and shampoo to wash your face and hair. Turn off the shower or move out of the shower stream. Pour the CHG onto a clean washcloth. Do not use any type of brush or rough-edged sponge. Starting at your neck, lather your body down to your toes. Make sure you follow these instructions: If you will be having surgery, pay special attention to the part of your body where you will be having surgery. Scrub this area for at least 1 minute. Do not use CHG on your head or face. If the solution gets into your ears or  eyes, rinse them well with water. Avoid your genital area. Avoid any areas of skin that have broken skin, cuts, or scrapes. Scrub your back and under your arms. Make sure to wash skin folds. Let the lather sit on your skin for 1-2 minutes or as long as told by your health care provider. Thoroughly rinse your entire body in the shower. Make sure that all body creases and crevices are rinsed well. Dry off with a clean towel. Do not put any substances on your body afterward--such as powder, lotion, or perfume--unless you are told to do so by your health care provider. Only use lotions that are recommended by the manufacturer. Put on clean clothes or pajamas. If it is the night before your surgery, sleep in clean sheets.  During a sponge bath Follow these steps when using CHG solution during a sponge bath (unless your health care provider gives you different instructions): Use your normal soap and shampoo to wash your face and hair. Pour the CHG onto a clean washcloth. Starting at your neck, lather your body down to your toes. Make sure you follow these instructions: If you will be having surgery, pay special attention to the part of your body where you will be having surgery. Scrub this area for at least 1 minute. Do not use CHG on your head or face. If the solution gets into your ears or eyes, rinse them well with water. Avoid your genital area. Avoid any areas of skin that have broken skin, cuts, or scrapes. Scrub your back and under your arms. Make sure to wash skin folds. Let the lather sit on your skin for 1-2 minutes or as long as told by your health care provider. Using a different clean, wet washcloth, thoroughly rinse your entire body. Make sure that all body creases and crevices are rinsed well. Dry off with a clean towel. Do not  put any substances on your body afterward--such as powder, lotion, or perfume--unless you are told to do so by your health care provider. Only use lotions that  are recommended by the manufacturer. Put on clean clothes or pajamas. If it is the night before your surgery, sleep in clean sheets. How to use CHG prepackaged cloths Only use CHG cloths as told by your health care provider, and follow the instructions on the label. Use the CHG cloth on clean, dry skin. Do not use the CHG cloth on your head or face unless your health care provider tells you to. When washing with the CHG cloth: Avoid your genital area. Avoid any areas of skin that have broken skin, cuts, or scrapes. Before surgery Follow these steps when using a CHG cloth to clean before surgery (unless your health care provider gives you different instructions): Using the CHG cloth, vigorously scrub the part of your body where you will be having surgery. Scrub using a back-and-forth motion for 3 minutes. The area on your body should be completely wet with CHG when you are done scrubbing. Do not rinse. Discard the cloth and let the area air-dry. Do not put any substances on the area afterward, such as powder, lotion, or perfume. Put on clean clothes or pajamas. If it is the night before your surgery, sleep in clean sheets.  For general bathing Follow these steps when using CHG cloths for general bathing (unless your health care provider gives you different instructions). Use a separate CHG cloth for each area of your body. Make sure you wash between any folds of skin and between your fingers and toes. Wash your body in the following order, switching to a new cloth after each step: The front of your neck, shoulders, and chest. Both of your arms, under your arms, and your hands. Your stomach and groin area, avoiding the genitals. Your right leg and foot. Your left leg and foot. The back of your neck, your back, and your buttocks. Do not rinse. Discard the cloth and let the area air-dry. Do not put any substances on your body afterward--such as powder, lotion, or perfume--unless you are told to  do so by your health care provider. Only use lotions that are recommended by the manufacturer. Put on clean clothes or pajamas. Contact a health care provider if: Your skin gets irritated after scrubbing. You have questions about using your solution or cloth. You swallow any chlorhexidine. Call your local poison control center (602 [phone] in the [place] Get help right away if: Your eyes itch badly, or they become very red or swollen. Your skin itches badly and is red or swollen. Your hearing changes. You have trouble seeing. You have swelling or tingling in your mouth or throat. You have trouble breathing. These symptoms may represent a serious problem that is an emergency. Do not wait to see if the symptoms will go away. Get medical help right away. Call your local emergency services (911 in the [place] Do not drive yourself to the hospital. Summary Chlorhexidine gluconate (CHG) is a germ-killing (antiseptic) solution that is used to clean the skin. Cleaning your skin with CHG may help to lower your risk for infection. You may be given CHG to use for bathing. It may be in a bottle or in a prepackaged cloth to use on your skin. Carefully follow your health care provider's instructions and the instructions on the product label. Do not use CHG if you have a chlorhexidine allergy. Contact your health care  provider if your skin gets irritated after scrubbing. This information is not intended to replace advice given to you by your health care provider. Make sure you discuss any questions you have with your health care provider. Document Revised: 08/16/2021 Document Reviewed: 06/29/2020 Elsevier Patient Education  2023 [employer].

## 2022-04-07 NOTE — H&P (Signed)
Faculty Practice Obstetrics and Gynecology Attending History and Physical  [name] is a 41 y.o. [phone] at Unknown who presents for scheduled Total abdominal hysterectomy, left salpingo-oophorectomy, right salpingectomy  In review, pt notes AUB for many years.  Previously treated with OCPs, but discontinued due to HTN.  Menses are typically 7-8 days; however, she has had prolonged bleeding for up to a 43mos.  Currently on Megace and having some spotting, but feels like her body is "trying to have a period.  Additionally, she notes side effects and wishes to have permanent surgical intervention.  Pelvic US was completed as part of work up for AUB and noted to have incidental left ovary.  See [place] report below.  Denies any abnormal vaginal discharge, fevers, chills, sweats, dysuria, nausea, vomiting, other GI or GU symptoms or other general symptoms.  Past Medical History:  Diagnosis Date   Anemia 01/20/2022   HBG 8.8 take OTC iron   Back pain    Depression    Diabetes ([place]) 01/20/2022   01/20/22 A1c is 7, will start lifestyle modifications and call PCP   Edema, lower extremity    Hypertension    PCOS (polycystic ovarian syndrome)    Past Surgical History:  Procedure Laterality Date   CESAREAN SECTION     2009, 2012, 2015   tubaligation     2015   OB History  Gravida Para Term Preterm AB Living  4 3 2 1 1 3   SAB IAB Ectopic Multiple Live Births  1       3    # Outcome Date GA Lbr Len/2nd Weight Sex Delivery Anes PTL Lv  4 Term 01/17/14    Jerilynn Mages CS-LTranv   LIV  3 Preterm 10/21/10 [redacted]w[redacted]d   F CS-LTranv   LIV  2 SAB 2011          1 Term 03/27/08    F CS-LTranv   LIV  Patient denies any other pertinent gynecologic issues.  No current facility-administered medications on file prior to encounter.   Current Outpatient Medications on File Prior to Encounter  Medication Sig Dispense Refill   cetirizine (ZYRTEC) 10 MG tablet Take 10 mg by mouth daily.     hydrochlorothiazide  (HYDRODIURIL) 25 MG tablet Take 12.5 mg by mouth every morning.     ibuprofen (ADVIL,MOTRIN) 200 MG tablet Take 200 mg by mouth every 6 (six) hours as needed for fever, headache or mild pain.     lisinopril (ZESTRIL) 5 MG tablet Take 5 mg by mouth daily.     megestrol (MEGACE) 40 MG tablet Take 3 x 5 days then 2 x 5 days then 1 daily till no bleeding (Patient taking differently: Take 40 mg by mouth daily.) 45 tablet 1   omeprazole (PRILOSEC OTC) 20 MG tablet Take 20 mg by mouth daily.     No Known Allergies  Social History:   reports that she has never smoked. She has never used smokeless tobacco. She reports that she does not drink alcohol and does not use drugs. Family History  Problem Relation Age of Onset   Heart attack Paternal Grandfather    Other Paternal Grandmother        fatty liver   Cancer Maternal Grandfather        lung   Cancer Father        lung   Hypertension Father    Hyperlipidemia Father    Rheum arthritis Mother    Colon cancer Sister  Review of Systems: Pertinent items noted in HPI and remainder of comprehensive ROS otherwise negative.  PHYSICAL EXAM: Blood pressure (!) 135/95, pulse 94, temperature 98.5 F (36.9 C), temperature source Oral, height 5\' 11"  (1.803 m), weight 128.5 kg, last menstrual period 01/12/2022, SpO2 100 %.  CONSTITUTIONAL: Well-developed, well-nourished female in no acute distress.  SKIN: Skin is warm and dry. No rash noted. Not diaphoretic. No erythema. No pallor. NEUROLOGIC: Alert and oriented to person, place, and time. Normal reflexes, muscle tone coordination. No cranial nerve deficit noted. PSYCHIATRIC: Normal mood and affect. Normal behavior. Normal judgment and thought content. CARDIOVASCULAR: Normal heart rate noted, regular rhythm RESPIRATORY: Effort and breath sounds normal, no problems with respiration noted ABDOMEN: Soft, nontender, nondistended. PELVIC: deferred MUSCULOSKELETAL: no calf tenderness bilaterally EXT: no  edema bilaterally, normal pulses  Labs: Results for orders placed or performed during the hospital encounter of 04/11/22 (from the past 336 hour(s))  Hemoglobin A1c   Collection Time: 04/11/22  9:24 AM  Result Value Ref Range   Hgb A1c MFr Bld 6.5 (H) 4.8 - 5.6 %   Mean Plasma Glucose 140 mg/dL  CBC   Collection Time: 04/11/22  9:24 AM  Result Value Ref Range   WBC 8.5 4.0 - 10.5 K/uL   RBC 4.68 3.87 - 5.11 MIL/uL   Hemoglobin 11.5 (L) 12.0 - 15.0 g/dL   HCT 37.2 36.0 - 46.0 %   MCV 79.5 (L) 80.0 - 100.0 fL   MCH 24.6 (L) 26.0 - 34.0 pg   MCHC 30.9 30.0 - 36.0 g/dL   RDW 17.1 (H) 11.5 - 15.5 %   Platelets 332 150 - 400 K/uL   nRBC 0.0 0.0 - 0.2 %  Basic metabolic panel   Collection Time: 04/11/22  9:24 AM  Result Value Ref Range   Sodium 138 135 - 145 mmol/L   Potassium 4.1 3.5 - 5.1 mmol/L   Chloride 109 98 - 111 mmol/L   CO2 23 22 - 32 mmol/L   Glucose, Bld 112 (H) 70 - 99 mg/dL   BUN 12 6 - 20 mg/dL   Creatinine, Ser 0.76 0.44 - 1.00 mg/dL   Calcium 9.0 8.9 - 10.3 mg/dL   GFR, Estimated >60 >60 mL/min   Anion gap 6 5 - 15  Type and screen   Collection Time: 04/11/22  9:24 AM  Result Value Ref Range   ABO/RH(D) A POS    Antibody Screen NEG    Sample Expiration 04/25/2022,2359    Extend sample reason      NO TRANSFUSIONS OR PREGNANCY IN THE PAST 3 MONTHS Performed at [facility], [address], [place], [place] [address]   Pregnancy, urine POC   Collection Time: 04/11/22  9:44 AM  Result Value Ref Range   Preg Test, Ur NEGATIVE NEGATIVE   ROMA: low risk, CA125: 59.8   Imaging Studies: Recent US 03/02/2022:  11.5 x 7.5 x 8.9 cm = volume: 400 mL. Anteverted. Heterogeneous myometrium. Scattered shadowing. Asymmetric thickening of posterior wall. Findings suggest adenomyosis. No discrete mass.   Complex cystic lesion LEFT ovary 6.6 cm diameter, containing several septations and an area of wall irregularity/thickening, indeterminate, ovarian malignancy not  excluded; surgical evaluation recommended.   Prior left complex cyst measured 7.8cm on [place] in Sept 2023  Assessment: Abnormal uterine bleeding Left ovarian cyst   Plan: Total abdominal hysterectomy, left salpingo-oophorectomy, right salpingectomy  -NPO -Toradol -LR @ 125cc/hr -SCDs to OR -Risk/benefits and alternatives reviewed with the patient including but not  limited to risk of bleeding, infection and injury to surrounding organs.  Discussed potential need for further intervention should a malignancy be noted.  Questions and concerns were addressed and pt desires to proceed  Myna Hidalgo, DO Attending Obstetrician & Gynecologist, [facility] for [facility], [facility]

## 2022-04-11 ENCOUNTER — Encounter (HOSPITAL_COMMUNITY): Payer: Self-pay

## 2022-04-11 ENCOUNTER — Encounter (HOSPITAL_COMMUNITY)
Admission: RE | Admit: 2022-04-11 | Discharge: 2022-04-11 | Disposition: A | Discharge status: Home / Self Care | Payer: Medicaid Other | Source: Ambulatory Visit | Attending: Obstetrics & Gynecology | Admitting: Obstetrics & Gynecology

## 2022-04-11 ENCOUNTER — Other Ambulatory Visit: Payer: Self-pay | Admitting: Adult Health

## 2022-04-11 VITALS — BP 149/103 | HR 90 | Temp 97.8°F | Resp 18 | Ht 71.0 in | Wt 283.3 lb

## 2022-04-11 DIAGNOSIS — N921 Excessive and frequent menstruation with irregular cycle: Secondary | ICD-10-CM | POA: Insufficient documentation

## 2022-04-11 DIAGNOSIS — R9431 Abnormal electrocardiogram [ECG] [EKG]: Secondary | ICD-10-CM | POA: Insufficient documentation

## 2022-04-11 DIAGNOSIS — N83202 Unspecified ovarian cyst, left side: Secondary | ICD-10-CM | POA: Diagnosis not present

## 2022-04-11 DIAGNOSIS — Z01818 Encounter for other preprocedural examination: Secondary | ICD-10-CM | POA: Insufficient documentation

## 2022-04-11 DIAGNOSIS — I10 Essential (primary) hypertension: Secondary | ICD-10-CM | POA: Diagnosis not present

## 2022-04-11 DIAGNOSIS — E119 Type 2 diabetes mellitus without complications: Secondary | ICD-10-CM | POA: Insufficient documentation

## 2022-04-11 LAB — CBC
HCT: 37.2 % (ref 36.0–46.0)
Hemoglobin: 11.5 g/dL — ABNORMAL LOW (ref 12.0–15.0)
MCH: 24.6 pg — ABNORMAL LOW (ref 26.0–34.0)
MCHC: 30.9 g/dL (ref 30.0–36.0)
MCV: 79.5 fL — ABNORMAL LOW (ref 80.0–100.0)
Platelets: 332 10*3/uL (ref 150–400)
RBC: 4.68 MIL/uL (ref 3.87–5.11)
RDW: 17.1 % — ABNORMAL HIGH (ref 11.5–15.5)
WBC: 8.5 10*3/uL (ref 4.0–10.5)
nRBC: 0 % (ref 0.0–0.2)

## 2022-04-11 LAB — POCT PREGNANCY, URINE: Preg Test, Ur: NEGATIVE

## 2022-04-11 LAB — BASIC METABOLIC PANEL
Anion gap: 6 (ref 5–15)
BUN: 12 mg/dL (ref 6–20)
CO2: 23 mmol/L (ref 22–32)
Calcium: 9 mg/dL (ref 8.9–10.3)
Chloride: 109 mmol/L (ref 98–111)
Creatinine, Ser: 0.76 mg/dL (ref 0.44–1.00)
GFR, Estimated: >60 mL/min (ref >60)
Glucose, Bld: 112 mg/dL — ABNORMAL HIGH (ref 70–99)
Potassium: 4.1 mmol/L (ref 3.5–5.1)
Sodium: 138 mmol/L (ref 135–145)

## 2022-04-11 LAB — TYPE AND SCREEN
ABO/RH(D): A POS
Antibody Screen: NEGATIVE

## 2022-04-11 MED ORDER — METFORMIN HCL 500 MG PO TABS: 500.0000 mg | ORAL_TABLET | Freq: Two times a day (BID) | ORAL | 2 refills | Status: AC

## 2022-04-11 NOTE — Progress Notes (Signed)
Refilled metformin for 90 days supple

## 2022-04-12 LAB — HEMOGLOBIN A1C
Hgb A1c MFr Bld: 6.5 % — ABNORMAL HIGH (ref 4.8–5.6)
Mean Plasma Glucose: 140 mg/dL

## 2022-04-13 ENCOUNTER — Ambulatory Visit (HOSPITAL_BASED_OUTPATIENT_CLINIC_OR_DEPARTMENT_OTHER): Payer: Medicaid Other | Admitting: Certified Registered Nurse Anesthetist

## 2022-04-13 ENCOUNTER — Ambulatory Visit (HOSPITAL_COMMUNITY): Payer: Medicaid Other | Admitting: Certified Registered Nurse Anesthetist

## 2022-04-13 ENCOUNTER — Encounter (HOSPITAL_COMMUNITY)
Admission: RE | Disposition: A | Discharge status: Home / Self Care | Payer: Self-pay | Source: Home / Self Care | Attending: Obstetrics & Gynecology

## 2022-04-13 ENCOUNTER — Encounter (HOSPITAL_COMMUNITY): Payer: Self-pay | Admitting: Obstetrics & Gynecology

## 2022-04-13 ENCOUNTER — Ambulatory Visit (HOSPITAL_COMMUNITY)
Admission: RE | Admit: 2022-04-13 | Discharge: 2022-04-13 | Disposition: A | Discharge status: Home / Self Care | Payer: Medicaid Other | Attending: Obstetrics & Gynecology | Admitting: Obstetrics & Gynecology

## 2022-04-13 ENCOUNTER — Encounter: Payer: Self-pay | Admitting: Obstetrics & Gynecology

## 2022-04-13 DIAGNOSIS — I10 Essential (primary) hypertension: Secondary | ICD-10-CM | POA: Insufficient documentation

## 2022-04-13 DIAGNOSIS — D649 Anemia, unspecified: Secondary | ICD-10-CM

## 2022-04-13 DIAGNOSIS — Z01818 Encounter for other preprocedural examination: Secondary | ICD-10-CM

## 2022-04-13 DIAGNOSIS — N83202 Unspecified ovarian cyst, left side: Secondary | ICD-10-CM

## 2022-04-13 DIAGNOSIS — E119 Type 2 diabetes mellitus without complications: Secondary | ICD-10-CM

## 2022-04-13 DIAGNOSIS — F32A Depression, unspecified: Secondary | ICD-10-CM | POA: Insufficient documentation

## 2022-04-13 DIAGNOSIS — N939 Abnormal uterine and vaginal bleeding, unspecified: Secondary | ICD-10-CM | POA: Insufficient documentation

## 2022-04-13 DIAGNOSIS — N840 Polyp of corpus uteri: Secondary | ICD-10-CM

## 2022-04-13 DIAGNOSIS — E669 Obesity, unspecified: Secondary | ICD-10-CM | POA: Insufficient documentation

## 2022-04-13 DIAGNOSIS — N7011 Chronic salpingitis: Secondary | ICD-10-CM | POA: Insufficient documentation

## 2022-04-13 DIAGNOSIS — N921 Excessive and frequent menstruation with irregular cycle: Secondary | ICD-10-CM

## 2022-04-13 DIAGNOSIS — Z6839 Body mass index (BMI) 39.0-39.9, adult: Secondary | ICD-10-CM | POA: Insufficient documentation

## 2022-04-13 DIAGNOSIS — N8003 Adenomyosis of the uterus: Secondary | ICD-10-CM

## 2022-04-13 DIAGNOSIS — Z7984 Long term (current) use of oral hypoglycemic drugs: Secondary | ICD-10-CM

## 2022-04-13 DIAGNOSIS — N926 Irregular menstruation, unspecified: Secondary | ICD-10-CM

## 2022-04-13 HISTORY — PX: BILATERAL SALPINGECTOMY: SHX5743

## 2022-04-13 HISTORY — PX: OOPHORECTOMY: SHX6387

## 2022-04-13 HISTORY — PX: ABDOMINAL HYSTERECTOMY: SHX81

## 2022-04-13 LAB — GLUCOSE, CAPILLARY
Glucose-Capillary: 113 mg/dL — ABNORMAL HIGH (ref 70–99)
Glucose-Capillary: 157 mg/dL — ABNORMAL HIGH (ref 70–99)

## 2022-04-13 LAB — ABO/RH: ABO/RH(D): A POS

## 2022-04-13 SURGERY — HYSTERECTOMY, ABDOMINAL
Anesthesia: General | Site: Abdomen

## 2022-04-13 MED ORDER — OXYCODONE HCL 5 MG PO TABS
ORAL_TABLET | ORAL | Status: AC
  Filled 2022-04-13: qty 1

## 2022-04-13 MED ORDER — SUGAMMADEX SODIUM 500 MG/5ML IV SOLN
INTRAVENOUS | Status: AC
  Filled 2022-04-13: qty 5

## 2022-04-13 MED ORDER — SEVOFLURANE IN SOLN
RESPIRATORY_TRACT | Status: AC
  Filled 2022-04-13: qty 250

## 2022-04-13 MED ORDER — LIDOCAINE HCL (PF) 2 % IJ SOLN
INTRAMUSCULAR | Status: AC
  Filled 2022-04-13: qty 5

## 2022-04-13 MED ORDER — FENTANYL CITRATE (PF) 250 MCG/5ML IJ SOLN
INTRAMUSCULAR | Status: DC | PRN
  Administered 2022-04-13: 100 ug via INTRAVENOUS
  Administered 2022-04-13 (×3): 50 ug via INTRAVENOUS

## 2022-04-13 MED ORDER — LIDOCAINE 2% (20 MG/ML) 5 ML SYRINGE
INTRAMUSCULAR | Status: DC | PRN
  Administered 2022-04-13: 70 mg via INTRAVENOUS

## 2022-04-13 MED ORDER — PROPOFOL 10 MG/ML IV BOLUS
INTRAVENOUS | Status: DC | PRN
  Administered 2022-04-13: 150 mg via INTRAVENOUS

## 2022-04-13 MED ORDER — LABETALOL HCL 5 MG/ML IV SOLN
INTRAVENOUS | Status: DC | PRN
  Administered 2022-04-13: 5 mg via INTRAVENOUS

## 2022-04-13 MED ORDER — ACETAMINOPHEN 325 MG PO TABS: 650.0000 mg | ORAL_TABLET | Freq: Four times a day (QID) | ORAL | 2 refills | Status: AC | PRN

## 2022-04-13 MED ORDER — LACTATED RINGERS IV SOLN: INTRAVENOUS | Status: DC

## 2022-04-13 MED ORDER — SUGAMMADEX SODIUM 200 MG/2ML IV SOLN
INTRAVENOUS | Status: DC | PRN
  Administered 2022-04-13: 514 mg via INTRAVENOUS

## 2022-04-13 MED ORDER — DEXAMETHASONE SODIUM PHOSPHATE 10 MG/ML IJ SOLN
INTRAMUSCULAR | Status: DC | PRN
  Administered 2022-04-13: 5 mg via INTRAVENOUS

## 2022-04-13 MED ORDER — FENTANYL CITRATE (PF) 250 MCG/5ML IJ SOLN
INTRAMUSCULAR | Status: AC
  Filled 2022-04-13: qty 5

## 2022-04-13 MED ORDER — ORAL CARE MOUTH RINSE: 15.0000 mL | Freq: Once | OROMUCOSAL | Status: DC

## 2022-04-13 MED ORDER — ONDANSETRON HCL 4 MG/2ML IJ SOLN
4.0000 mg | Freq: Once | INTRAMUSCULAR | Status: AC | PRN
  Administered 2022-04-13: 4 mg via INTRAVENOUS
  Filled 2022-04-13 (×2): qty 2

## 2022-04-13 MED ORDER — LABETALOL HCL 5 MG/ML IV SOLN
INTRAVENOUS | Status: AC
  Filled 2022-04-13: qty 4

## 2022-04-13 MED ORDER — ONDANSETRON HCL 4 MG/2ML IJ SOLN
INTRAMUSCULAR | Status: AC
  Filled 2022-04-13: qty 2

## 2022-04-13 MED ORDER — MIDAZOLAM HCL 2 MG/2ML IJ SOLN
INTRAMUSCULAR | Status: AC
  Filled 2022-04-13: qty 2

## 2022-04-13 MED ORDER — ROCURONIUM BROMIDE 10 MG/ML (PF) SYRINGE
PREFILLED_SYRINGE | INTRAVENOUS | Status: AC
  Filled 2022-04-13: qty 10

## 2022-04-13 MED ORDER — HEMOSTATIC AGENTS (NO CHARGE) OPTIME
TOPICAL | Status: DC | PRN
  Administered 2022-04-13: 1 via TOPICAL

## 2022-04-13 MED ORDER — BUPIVACAINE-MELOXICAM ER 400-12 MG/14ML IJ SOLN
INTRAMUSCULAR | Status: DC | PRN
  Administered 2022-04-13: 200 mg

## 2022-04-13 MED ORDER — OXYCODONE HCL 5 MG PO TABS
5.0000 mg | ORAL_TABLET | Freq: Once | ORAL | Status: AC
  Administered 2022-04-13: 5 mg via ORAL

## 2022-04-13 MED ORDER — ROCURONIUM BROMIDE 10 MG/ML (PF) SYRINGE
PREFILLED_SYRINGE | INTRAVENOUS | Status: DC | PRN
  Administered 2022-04-13: 60 mg via INTRAVENOUS
  Administered 2022-04-13: 30 mg via INTRAVENOUS
  Administered 2022-04-13: 10 mg via INTRAVENOUS

## 2022-04-13 MED ORDER — ONDANSETRON HCL 4 MG/2ML IJ SOLN
INTRAMUSCULAR | Status: DC | PRN
  Administered 2022-04-13: 4 mg via INTRAVENOUS

## 2022-04-13 MED ORDER — DEXAMETHASONE SODIUM PHOSPHATE 10 MG/ML IJ SOLN
INTRAMUSCULAR | Status: AC
  Filled 2022-04-13: qty 1

## 2022-04-13 MED ORDER — CHLORHEXIDINE GLUCONATE 0.12 % MT SOLN: 15.0000 mL | Freq: Once | OROMUCOSAL | Status: DC

## 2022-04-13 MED ORDER — KETOROLAC TROMETHAMINE 10 MG PO TABS: 10.0000 mg | ORAL_TABLET | Freq: Three times a day (TID) | ORAL | 0 refills | Status: AC

## 2022-04-13 MED ORDER — MIDAZOLAM HCL 5 MG/5ML IJ SOLN
INTRAMUSCULAR | Status: DC | PRN
  Administered 2022-04-13: 2 mg via INTRAVENOUS

## 2022-04-13 MED ORDER — PROPOFOL 10 MG/ML IV BOLUS
INTRAVENOUS | Status: AC
  Filled 2022-04-13: qty 20

## 2022-04-13 MED ORDER — POVIDONE-IODINE 10 % EX SWAB: 2.0000 | Freq: Once | CUTANEOUS | Status: DC

## 2022-04-13 MED ORDER — ONDANSETRON 4 MG PO TBDP: 4.0000 mg | ORAL_TABLET | Freq: Three times a day (TID) | ORAL | 0 refills | Status: DC | PRN

## 2022-04-13 MED ORDER — HYDROMORPHONE HCL 1 MG/ML IJ SOLN
0.2500 mg | INTRAMUSCULAR | Status: DC | PRN
  Administered 2022-04-13 (×2): 0.5 mg via INTRAVENOUS
  Filled 2022-04-13 (×2): qty 0.5

## 2022-04-13 MED ORDER — KETOROLAC TROMETHAMINE 15 MG/ML IJ SOLN
30.0000 mg | INTRAMUSCULAR | Status: AC
  Administered 2022-04-13: 30 mg via INTRAVENOUS
  Filled 2022-04-13: qty 2

## 2022-04-13 MED ORDER — ACETAMINOPHEN 10 MG/ML IV SOLN
1000.0000 mg | Freq: Once | INTRAVENOUS | Status: AC
  Administered 2022-04-13: 1000 mg via INTRAVENOUS
  Filled 2022-04-13: qty 100

## 2022-04-13 MED ORDER — 0.9 % SODIUM CHLORIDE (POUR BTL) OPTIME
TOPICAL | Status: DC | PRN
  Administered 2022-04-13: 2000 mL

## 2022-04-13 MED ORDER — IBUPROFEN 600 MG PO TABS: 600.0000 mg | ORAL_TABLET | Freq: Four times a day (QID) | ORAL | 0 refills | Status: DC | PRN

## 2022-04-13 MED ORDER — BUPIVACAINE-MELOXICAM ER 200-6 MG/7ML IJ SOLN
INTRAMUSCULAR | Status: AC
  Filled 2022-04-13: qty 1

## 2022-04-13 MED ORDER — GABAPENTIN 400 MG PO CAPS: 400.0000 mg | ORAL_CAPSULE | Freq: Three times a day (TID) | ORAL | 0 refills | Status: DC

## 2022-04-13 MED ORDER — DOCUSATE SODIUM 100 MG PO CAPS: 100.0000 mg | ORAL_CAPSULE | Freq: Two times a day (BID) | ORAL | 0 refills | Status: DC

## 2022-04-13 MED ORDER — CEFAZOLIN IN SODIUM CHLORIDE 3-0.9 GM/100ML-% IV SOLN
3.0000 g | INTRAVENOUS | Status: AC
  Administered 2022-04-13: 3 g via INTRAVENOUS
  Filled 2022-04-13: qty 100

## 2022-04-13 MED ORDER — OXYCODONE HCL 5 MG PO TABS: 5.0000 mg | ORAL_TABLET | Freq: Four times a day (QID) | ORAL | 0 refills | Status: AC | PRN

## 2022-04-13 SURGICAL SUPPLY — 51 items
ADH SKN CLS APL DERMABOND .7 (GAUZE/BANDAGES/DRESSINGS) ×4
APL SWBSTK 6 STRL LF DISP (MISCELLANEOUS) ×4
APPLICATOR COTTON TIP 6 STRL (MISCELLANEOUS) ×4 IMPLANT
APPLICATOR COTTON TIP 6IN STRL (MISCELLANEOUS) ×4
APPLIER CLIP 13 LRG OPEN (CLIP) ×4
APR CLP LRG 13 20 CLIP (CLIP) ×4
CLIP APPLIE 13 LRG OPEN (CLIP) ×1 IMPLANT
CLOTH BEACON ORANGE TIMEOUT ST (SAFETY) ×4 IMPLANT
COVER LIGHT HANDLE STERIS (MISCELLANEOUS) ×8 IMPLANT
DERMABOND ADVANCED .7 DNX12 (GAUZE/BANDAGES/DRESSINGS) ×4 IMPLANT
DRAPE LAPAROTOMY TRNSV 102X78 (DRAPES) ×4 IMPLANT
DRAPE WARM FLUID 44X44 (DRAPES) ×1 IMPLANT
DRSG OPSITE POSTOP 4X10 (GAUZE/BANDAGES/DRESSINGS) IMPLANT
DRSG OPSITE POSTOP 4X8 (GAUZE/BANDAGES/DRESSINGS) ×1 IMPLANT
DURAPREP 26ML APPLICATOR (WOUND CARE) ×4 IMPLANT
ELECT REM PT RETURN 9FT ADLT (ELECTROSURGICAL) ×4
ELECTRODE REM PT RTRN 9FT ADLT (ELECTROSURGICAL) ×4 IMPLANT
GAUZE 4X4 16PLY ~~LOC~~+RFID DBL (SPONGE) ×8 IMPLANT
GLOVE BIO SURGEON STRL SZ 6.5 (GLOVE) ×6 IMPLANT
GLOVE BIOGEL PI IND STRL 6.5 (GLOVE) ×1 IMPLANT
GLOVE BIOGEL PI IND STRL 7.0 (GLOVE) ×13 IMPLANT
GLOVE BIOGEL PI IND STRL 8 (GLOVE) ×1 IMPLANT
GLOVE ECLIPSE 8.0 STRL XLNG CF (GLOVE) ×1 IMPLANT
GOWN STRL REUS W/ TWL LRG LVL3 (GOWN DISPOSABLE) ×4 IMPLANT
GOWN STRL REUS W/TWL LRG LVL3 (GOWN DISPOSABLE) ×9 IMPLANT
GOWN STRL REUS W/TWL XL LVL3 (GOWN DISPOSABLE) ×4 IMPLANT
KIT BLADEGUARD II DBL (SET/KITS/TRAYS/PACK) ×4 IMPLANT
KIT TURNOVER KIT A (KITS) ×4 IMPLANT
NS IRRIG 1000ML POUR BTL (IV SOLUTION) ×5 IMPLANT
PACK ABDOMINAL MAJOR (CUSTOM PROCEDURE TRAY) ×4 IMPLANT
PAD ARMBOARD 7.5X6 YLW CONV (MISCELLANEOUS) ×4 IMPLANT
PENCIL SMOKE EVACUATOR (MISCELLANEOUS) ×4 IMPLANT
POWDER SURGICEL 3.0 GRAM (HEMOSTASIS) ×1 IMPLANT
RETRACTOR TRAXI PANNICULUS (MISCELLANEOUS) ×1 IMPLANT
RETRACTOR WOUND ALXS 18CM MED (MISCELLANEOUS) ×4 IMPLANT
RTRCTR WOUND ALEXIS O 18CM MED (MISCELLANEOUS) ×4
SET BASIN LINEN APH (SET/KITS/TRAYS/PACK) ×8 IMPLANT
SOL PREP POV-IOD 4OZ 10% (MISCELLANEOUS) ×4 IMPLANT
SPONGE T-LAP 18X18 ~~LOC~~+RFID (SPONGE) ×8 IMPLANT
SUT PLAIN CT 1/2CIR 2-0 27IN (SUTURE) ×7 IMPLANT
SUT VIC AB 0 CT1 27 (SUTURE) ×12
SUT VIC AB 0 CT1 27XCR 8 STRN (SUTURE) ×9 IMPLANT
SUT VIC AB 0 CT1 36 (SUTURE) ×4 IMPLANT
SUT VIC AB 0 CTX 36 (SUTURE) ×4
SUT VIC AB 0 CTX36XBRD ANTBCTR (SUTURE) ×4 IMPLANT
SUT VIC AB 2-0 CT1 27 (SUTURE) ×4
SUT VIC AB 2-0 CT1 TAPERPNT 27 (SUTURE) ×1 IMPLANT
SUT VICRYL 3 0 (SUTURE) ×4 IMPLANT
TOWEL ~~LOC~~+RFID 17X26 BLUE (SPONGE) ×4 IMPLANT
TRAY FOLEY W/BAG SLVR 16FR (SET/KITS/TRAYS/PACK) ×4
TRAY FOLEY W/BAG SLVR 16FR ST (SET/KITS/TRAYS/PACK) ×4 IMPLANT

## 2022-04-13 NOTE — Anesthesia Postprocedure Evaluation (Signed)
Anesthesia Post Note  Patient: SHAWNITA KRIZEK Corsetti  Procedure(s) Performed: HYSTERECTOMY ABDOMINAL (Abdomen) OPEN BILATERAL SALPINGECTOMY (Abdomen) OOPHORECTOMY (Left: Abdomen)  Patient location during evaluation: PACU Anesthesia Type: General Level of consciousness: awake and alert Pain management: pain level controlled Vital Signs Assessment: post-procedure vital signs reviewed and stable Respiratory status: spontaneous breathing, nonlabored ventilation, respiratory function stable and patient connected to nasal cannula oxygen Cardiovascular status: blood pressure returned to baseline and stable Postop Assessment: no apparent nausea or vomiting Anesthetic complications: no   There were no known notable events for this encounter.   Last Vitals:  Vitals:   04/13/22 1153 04/13/22 1441  BP: 109/88 (!) 149/96  Pulse: 81 88  Resp: 17 17  Temp: 36.6 C 36.8 C  SpO2: 100% 98%    Last Pain:  Vitals:   04/13/22 1130  TempSrc:   PainSc: 4                  Glynis Smiles

## 2022-04-13 NOTE — Op Note (Signed)
Preoperative diagnosis:  1) Abnormal uterine bleeding 2) Left ovarian cyst                                          Postoperative diagnosis:  same as above  Procedure:  Total abdominal hysterectomy, bilateral salpingectomy and left oophorectomy  Surgeon:  Dr. Myna Hidalgo  Assistant:  Dr. Turner Daniels  Anesthesia:  General endotracheal  Intraoperative findings: 1) Boggy 11cm uterus, normal right tube and ovary.  Enlarged left ovary with cystic 6cm cyst.  EBL: 200cc IVF: 50cc UOP: 1200cc  Description of operation:    Patient was taken to the operating room and placed in the supine position where she underwent general endotracheal anesthesia.  She was then prepped and draped in the usual sterile fashion and a Foley catheter was placed for continuous bladder drainage.  A 4 cm transverse skin incision was made 1 fingerbreadth above the pubis and carried down sharply to the rectus fascia which was scored in the midline and extended laterally.  The fascia was taken off the muscles superiorly and inferiorly without difficulty.  The muscles were divided.  The peritoneal cavity was entered. Adhesions were noted  between the omentum and uterus that were dissected bluntly.  A small Alexis self retaining retractor was placed. Pelvic washings were obtained.  The upper abdomen was packed with wet lap pads.  Both uterine cornu were grasped with Kocker clamps.  The left adnexa was grasped.  An avascular window was made in the left utero ovarian ligament broad ligament.  The IP was then clamped, cut and suture ligated.  The left fallopian tube and ovary were sent to pathology.  The left round ligament was suture ligated.  The vesicouterine space was developed.  The bladder was densely adherent to uterus.   Attention was turned to the right side.  The left round ligament was suture ligated.  An avascular window was made in the broad ligament near the ovarian ligament.  This was clamped, cut and suture ligated  with preservation of the ovary.  The remaining portion of the vesciouterine space was developed.    Serial pedicles were clamped, cut and suture ligated and medial to the initial ovarian ligament pedicle.  The uterine vessels were clamped bilaterally,  then cut and suture ligated. Several  more pedicles were taken down the cervix medial to the uterine vessels.  Each pedicle was clamped cut and suture ligated with good resulting hemostasis.The vagina was cross clamped and the uterus with cervix was removed and sent to pathology.  The vagina was closed with interrupted figure of eight sutures with good hemostasis.     Attention was turned to the right adnexa.  The right fallopian tube was grasped with a babcock and using a Kelly clamp was cut, clamped and ligated.    The pelvis was irrigated vigorously and all pedicles were examined and found to be hemostatic.  All counts were correct at this point x 3.  Surgicel  was used for additional hemostasis. The fascia was closed with 0 Vicryl running. Zyrnrelief was injected into the subcutaneous tissue for post operative pain management.   The skin was closed using 3-0 Vicryl on a Keith needle in a subcuticular fashion.  Dermabond was then applied for additional wound integrity and to serve as a postoperative bacterial barrier.    The patient was awakened from anesthesia taken to the  recovery room in good stable condition. All sponge instrument and needle counts were correct x 3.  The patient received Ancef and Toradol prophylactically preoperatively.  An experienced assistant was required given the standard of surgical care given the complexity of the case.  This assistant was needed for exposure, dissection, suctioning, retraction, instrument exchange and for overall help during the procedure.  Myna Hidalgo, DO Attending Obstetrician & Gynecologist, [facility] for [employer], [facility]

## 2022-04-13 NOTE — Discharge Instructions (Addendum)
Post Operative Pain Med Plan:  >Take gabapentin 300 mg three times per day, as prescribed for 4 days, try to space them evenly  >Take the oxycodone- 1 tablet, on a schedule, around the clock, every 6 hours(set your phone alarm) for the first 2 days, there may be times when you will need 2 tablets but taking them on a schedule will decrease this need  >You can also take Tylenol together with the oxycodone.  If the oxycodone seems "to strong" then just take Tylenol  >Oxycodone will cause constipation, please be sure to take a stool softener (Colace) twice daily while taking this pain medication and/or continue this medication until your bowel regimen returns to normal  >Take the Toradol every 8 hours for the first 3 days then the remainder to supplement the pain as needed  >After the toradol is gone switch to Ibuprofen 600mg  every 6 hours as needed  If possible try to take the Toradol or Ibuprofen with food to help avoid upsetting your stomach  >Use a heating pad as well as needed  >I have also sent a prescription for zofran (ondansetron) for nausea to take if needed over the first couple of days  >Be gentle with your diet the first few days, liquids and soft non spicy food, fruits are great  >Get up and move, no lifting or straining  HOME INSTRUCTIONS  Please note any unusual or excessive bleeding, pain, swelling. Mild dizziness or drowsiness are normal for about 24 hours after surgery.   Shower when comfortable  Restrictions: No driving for 24 hours or while taking pain medications.  Activity:  No heavy lifting (> 10 lbs), nothing in vagina (no tampons, douching, or intercourse) x 4 weeks; no tub baths for 4 weeks Vaginal spotting is expected but if your bleeding is heavy, period like,  please call the office   Incision: the bandaids will fall off when they are ready to; you may clean your incision with mild soap and water but do not rub or scrub the incision site.  You may  experience slight bloody drainage from your incision periodically.  This is normal.  If you experience a large amount of drainage or the incision opens, please call your physician who will likely direct you to the emergency department.  Diet:  You may return to your regular diet.  Do not eat large meals.  Eat small frequent meals throughout the day.  Continue to drink a good amount of water at least 6-8 glasses of water per day, hydration is very important for the healing process.  Pain Management: Follow above instruactions.  Always take prescription pain medication with food.  Oxycodone may cause constipation, you may want to take a stool softener while taking this medication.  A prescription of colace has been sent in to take twice daily if needed while taking the oxycodone.  Be sure to drink plenty of fluids and increase your fiber to help with constipation.  Alcohol -- Avoid for 24 hours and while taking pain medications.  Nausea: Take sips of ginger ale or soda  Fever -- Call physician if temperature over 101 degrees  Follow up:  If you do not already have a follow up appointment scheduled, please call the office at [phone].  If you experience fever (a temperature greater than 100.4), pain unrelieved by pain medication, shortness of breath, swelling of a single leg, or any other symptoms which are concerning to you please the office immediately.

## 2022-04-13 NOTE — Transfer of Care (Signed)
Immediate Anesthesia Transfer of Care Note  Patient: [name]  Procedure(s) Performed: HYSTERECTOMY ABDOMINAL (Abdomen) OPEN BILATERAL SALPINGECTOMY (Abdomen) OOPHORECTOMY (Left: Abdomen)  Patient Location: PACU  Anesthesia Type:General  Level of Consciousness: awake, alert , and oriented  Airway & Oxygen Therapy: Patient Spontanous Breathing and Patient connected to nasal cannula oxygen  Post-op Assessment: Report given to RN, Post -op Vital signs reviewed and stable, Patient moving all extremities X 4, and Patient able to stick tongue midline  Post vital signs: Reviewed  Last Vitals:  Vitals Value Taken Time  BP 148/97   Temp 98.3   Pulse 97   Resp 18   SpO2 100     Last Pain:  Vitals:   04/13/22 0743  TempSrc: Oral  PainSc: 0-No pain      Patients Stated Pain Goal: 7 (04/13/22 0743)  Complications: No notable events documented.

## 2022-04-13 NOTE — Anesthesia Procedure Notes (Addendum)
Procedure Name: Intubation Date/Time: 04/13/2022 8:29 AM  Performed by: Cy Blamer, CRNAPre-anesthesia Checklist: Patient identified, Emergency Drugs available, Suction available and Patient being monitored Patient Re-evaluated:Patient Re-evaluated prior to induction Oxygen Delivery Method: Circle system utilized Preoxygenation: Pre-oxygenation with 100% oxygen Induction Type: IV induction Ventilation: Mask ventilation without difficulty Laryngoscope Size: Miller and 2 Grade View: Grade II Tube type: Oral Number of attempts: 1 Airway Equipment and Method: Stylet and Oral airway Placement Confirmation: ETT inserted through vocal cords under direct vision, positive ETCO2 and breath sounds checked- equal and bilateral Secured at: 22 cm Tube secured with: Tape Dental Injury: Teeth and Oropharynx as per pre-operative assessment

## 2022-04-13 NOTE — Anesthesia Preprocedure Evaluation (Addendum)
Anesthesia Evaluation  Patient identified by MRN, date of birth, ID band Patient awake    Reviewed: Allergy & Precautions, NPO status , Patient's Chart, lab work & pertinent test results  Airway Mallampati: II       Dental no notable dental hx.    Pulmonary    Pulmonary exam normal        Cardiovascular Exercise Tolerance: Good hypertension, Normal cardiovascular exam     Neuro/Psych  PSYCHIATRIC DISORDERS  Depression    negative neurological ROS     GI/Hepatic negative GI ROS, Neg liver ROS,,,  Endo/Other  diabetes    Renal/GU negative Renal ROS     Musculoskeletal negative musculoskeletal ROS (+)    Abdominal  (+) + obese  Peds  Hematology  (+) Blood dyscrasia, anemia   Anesthesia Other Findings   Reproductive/Obstetrics                             Anesthesia Physical Anesthesia Plan  ASA: 3  Anesthesia Plan: General   Post-op Pain Management:    Induction:   PONV Risk Score and Plan: 1  Airway Management Planned: Oral ETT  Additional Equipment:   Intra-op Plan:   Post-operative Plan:   Informed Consent: I have reviewed the patients History and Physical, chart, labs and discussed the procedure including the risks, benefits and alternatives for the proposed anesthesia with the patient or authorized representative who has indicated his/her understanding and acceptance.       Plan Discussed with: CRNA  Anesthesia Plan Comments:        Anesthesia Quick Evaluation

## 2022-04-14 ENCOUNTER — Encounter: Payer: Self-pay | Admitting: Obstetrics & Gynecology

## 2022-04-15 LAB — SURGICAL PATHOLOGY

## 2022-04-15 LAB — CYTOLOGY - NON PAP

## 2022-04-18 ENCOUNTER — Encounter (HOSPITAL_COMMUNITY): Payer: Self-pay | Admitting: Obstetrics & Gynecology

## 2022-04-20 ENCOUNTER — Ambulatory Visit (INDEPENDENT_AMBULATORY_CARE_PROVIDER_SITE_OTHER): Payer: Medicaid Other | Admitting: Obstetrics & Gynecology

## 2022-04-20 ENCOUNTER — Encounter: Payer: Self-pay | Admitting: Obstetrics & Gynecology

## 2022-04-20 VITALS — BP 131/84 | HR 92 | Ht 71.5 in | Wt 276.2 lb

## 2022-04-20 DIAGNOSIS — Z4889 Encounter for other specified surgical aftercare: Secondary | ICD-10-CM

## 2022-04-20 NOTE — Progress Notes (Signed)
    PostOp Visit Note  [name] is a 41 y.o. [phone] female who presents for a postoperative visit. She is 1 week postop following a TAH, bilateral salpingectomy and left oophorectomy completed on 12/13   Today she notes that it's been a slow process.  Notes decreased appetite, but denies vomiting or nausea.  Pain well controlled, just sore.  Taking just ibuprofen/tylenol. Denies fever or chills.  Tolerating gen diet.  +Flatus, Regular BMs.  Overall doing well and reports no acute complaints   Review of Systems Pertinent items are noted in HPI.    Objective:  BP 131/84 (BP Location: Left Arm, Patient Position: Sitting, Cuff Size: Normal)   Pulse 92   Ht 5' 11.5" (1.816 m)   Wt 276 lb 3.2 oz (125.3 kg)   LMP 01/12/2022   BMI 37.99 kg/m    Physical Examination:  GENERAL ASSESSMENT: well developed and well nourished SKIN: warm and dry CHEST: normal air exchange, respiratory effort normal with no retractions HEART: regular rate and rhythm ABDOMEN: soft, non-distended, +BS INCISION: honeycomb removed- incision C/D/I- healing appropriately EXTREMITY: no edema, no calf tenderness bilaterally PSYCH: mood appropriate, normal affect       Assessment:    Postop visit   Plan:   -meeting milestones appropriately -reviewed postop care, f/u in 5-6wks  Myna Hidalgo, DO Attending Obstetrician & Gynecologist, [facility] for [employer], [facility]

## 2022-05-17 ENCOUNTER — Other Ambulatory Visit: Payer: Self-pay | Admitting: Obstetrics & Gynecology

## 2022-05-17 DIAGNOSIS — Z1231 Encounter for screening mammogram for malignant neoplasm of breast: Secondary | ICD-10-CM

## 2022-05-25 ENCOUNTER — Ambulatory Visit (INDEPENDENT_AMBULATORY_CARE_PROVIDER_SITE_OTHER): Payer: 59 | Admitting: Obstetrics & Gynecology

## 2022-05-25 ENCOUNTER — Encounter: Payer: Self-pay | Admitting: Obstetrics & Gynecology

## 2022-05-25 VITALS — BP 130/92 | HR 80 | Ht 71.0 in | Wt 286.2 lb

## 2022-05-25 DIAGNOSIS — Z4889 Encounter for other specified surgical aftercare: Secondary | ICD-10-CM

## 2022-05-25 NOTE — Progress Notes (Signed)
    PostOp Visit Note  [name] is a 42 y.o. [phone] female who presents for a postoperative visit. She is 6 weeks postop following a TAH, left oophorectomy, bilateral salpingectomy completed on 04/13/22   Today she notes that she is doing well and has returned back to work.  On occasion takes ibuprofen when she is standing for a prolonged period. Denies fever or chills.  Tolerating gen diet.  +Flatus, Regular BMs.   Overall doing well and reports no acute complaints   Review of Systems Pertinent items are noted in HPI.    Objective:  BP (!) 130/92 (BP Location: Right Arm, Patient Position: Sitting, Cuff Size: Large)   Pulse 80   Ht 5\' 11"  (1.803 m)   Wt 286 lb 3.2 oz (129.8 kg)   LMP 01/12/2022   BMI 39.92 kg/m    Physical Examination:  GENERAL ASSESSMENT: well developed and well nourished SKIN: warm and dry CHEST: normal air exchange, respiratory effort normal with no retractions HEART: regular rate and rhythm ABDOMEN: soft, non-distended INCISION: well healed- C/D/I GU:  Vulva:  normal appearing vulva with no masses, tenderness or lesions Vagina:  normal mucosa, no discharge Uterus and cervix surgically absent.  Cuff appears intact visually.  On bimanual exam cuff well healed- no abnormalities or tenderness noted EXTREMITY: no edema, no calf tenderness bilaterally PSYCH: mood appropriate, normal affect       Assessment:    Postop visit   Plan:   -meeting milestones appropriately -may return to regular activity -f/u prn -continue yearly mammograms  Janyth Pupa, DO Attending [place], [place] for [employer], [place]

## 2022-05-27 ENCOUNTER — Ambulatory Visit
Admission: RE | Admit: 2022-05-27 | Discharge: 2022-05-27 | Disposition: A | Discharge status: Home / Self Care | Payer: 59 | Source: Ambulatory Visit | Attending: Obstetrics & Gynecology | Admitting: Obstetrics & Gynecology

## 2022-05-27 DIAGNOSIS — Z1231 Encounter for screening mammogram for malignant neoplasm of breast: Secondary | ICD-10-CM | POA: Diagnosis not present

## 2022-09-28 DIAGNOSIS — Z800 Family history of malignant neoplasm of digestive organs: Secondary | ICD-10-CM | POA: Diagnosis not present

## 2022-09-28 DIAGNOSIS — I10 Essential (primary) hypertension: Secondary | ICD-10-CM | POA: Diagnosis not present

## 2022-09-28 DIAGNOSIS — Z6839 Body mass index (BMI) 39.0-39.9, adult: Secondary | ICD-10-CM | POA: Diagnosis not present

## 2022-10-03 DIAGNOSIS — J209 Acute bronchitis, unspecified: Secondary | ICD-10-CM | POA: Diagnosis not present

## 2022-10-03 DIAGNOSIS — R350 Frequency of micturition: Secondary | ICD-10-CM | POA: Diagnosis not present

## 2022-10-03 DIAGNOSIS — Z6841 Body Mass Index (BMI) 40.0 and over, adult: Secondary | ICD-10-CM | POA: Diagnosis not present

## 2022-10-03 DIAGNOSIS — I10 Essential (primary) hypertension: Secondary | ICD-10-CM | POA: Diagnosis not present

## 2022-10-04 ENCOUNTER — Encounter: Payer: Self-pay | Admitting: *Deleted

## 2022-10-26 DIAGNOSIS — I10 Essential (primary) hypertension: Secondary | ICD-10-CM | POA: Diagnosis not present

## 2022-11-22 ENCOUNTER — Telehealth: Payer: Self-pay | Admitting: Internal Medicine

## 2022-11-22 ENCOUNTER — Telehealth: Payer: Self-pay | Admitting: *Deleted

## 2022-11-22 NOTE — Telephone Encounter (Signed)
Received questionnaire and it is in review.

## 2022-11-22 NOTE — Telephone Encounter (Signed)
  Procedure: Colonoscopy  Height: 5'11 Weight: 280lbs        Have you had a colonoscopy before?  no  Do you have family history of colon cancer?  Yes sister  Do you have a family history of polyps? yes  Previous colonoscopy with polyps removed? no  Do you have a history colorectal cancer?   no  Are you diabetic?  Pre diabetic  Do you have a prosthetic or mechanical heart valve? no  Do you have a pacemaker/defibrillator?   no  Have you had endocarditis/atrial fibrillation?  no  Do you use supplemental oxygen/CPAP?  no  Have you had joint replacement within the last 12 months?  no  Do you tend to be constipated or have to use laxatives?  no   Do you have history of alcohol use? If yes, how much and how often.  no  Do you have history or are you using drugs? If yes, what do are you  using?  no  Have you ever had a stroke/heart attack?  no  Have you ever had a heart or other vascular stent placed,?no  Do you take weight loss medication? no  female patients,: have you had a hysterectomy? yes                              are you post menopausal?                                do you still have your menstrual cycle? no    Date of last menstrual period?   Do you take any blood-thinning medications such as: (Plavix, aspirin, Coumadin, Aggrenox, Brilinta, Xarelto, Eliquis, Pradaxa, Savaysa or Effient)?   If yes we need the name, milligram, dosage and who is prescribing doctor:               Current Outpatient Medications  Medication Sig Dispense Refill   lisinopril (ZESTRIL) 5 MG tablet Take 5 mg by mouth daily.     metFORMIN (GLUCOPHAGE) 500 MG tablet Take 1 tablet (500 mg total) by mouth 2 (two) times daily with a meal. 180 tablet 2   No current facility-administered medications for this visit.    No Known Allergies

## 2022-12-09 NOTE — Telephone Encounter (Signed)
Ok to schedule. ASA 2.  S/p hysterectomy. Day of prep: metformin am only. Am of tcs: hold metformin

## 2022-12-14 MED ORDER — PEG 3350-KCL-NA BICARB-NACL 420 G PO SOLR: 4000.0000 mL | Freq: Once | ORAL | 0 refills | Status: AC

## 2022-12-14 NOTE — Addendum Note (Signed)
Addended by: Armstead Peaks on: 12/14/2022 02:40 PM   Modules accepted: Orders

## 2022-12-14 NOTE — Telephone Encounter (Signed)
CALLED PT. Scheduled with Dr. Marletta Lor 9/10. Aware will send instructions. Rx for prep sent to pharmacy.

## 2022-12-15 ENCOUNTER — Encounter: Payer: Self-pay | Admitting: *Deleted

## 2022-12-15 NOTE — Telephone Encounter (Signed)
Referral completed

## 2023-01-10 ENCOUNTER — Ambulatory Visit (HOSPITAL_BASED_OUTPATIENT_CLINIC_OR_DEPARTMENT_OTHER): Payer: 59 | Admitting: Registered Nurse

## 2023-01-10 ENCOUNTER — Ambulatory Visit (HOSPITAL_COMMUNITY)
Admission: RE | Admit: 2023-01-10 | Discharge: 2023-01-10 | Disposition: A | Discharge status: Home / Self Care | Payer: 59 | Attending: Internal Medicine | Admitting: Internal Medicine

## 2023-01-10 ENCOUNTER — Other Ambulatory Visit: Payer: Self-pay

## 2023-01-10 ENCOUNTER — Encounter (HOSPITAL_COMMUNITY)
Admission: RE | Disposition: A | Discharge status: Home / Self Care | Payer: Self-pay | Source: Home / Self Care | Attending: Internal Medicine

## 2023-01-10 ENCOUNTER — Ambulatory Visit (HOSPITAL_COMMUNITY): Payer: 59 | Admitting: Registered Nurse

## 2023-01-10 DIAGNOSIS — E119 Type 2 diabetes mellitus without complications: Secondary | ICD-10-CM

## 2023-01-10 DIAGNOSIS — K635 Polyp of colon: Secondary | ICD-10-CM | POA: Diagnosis not present

## 2023-01-10 DIAGNOSIS — Z1211 Encounter for screening for malignant neoplasm of colon: Secondary | ICD-10-CM

## 2023-01-10 DIAGNOSIS — Z7984 Long term (current) use of oral hypoglycemic drugs: Secondary | ICD-10-CM | POA: Diagnosis not present

## 2023-01-10 DIAGNOSIS — D126 Benign neoplasm of colon, unspecified: Secondary | ICD-10-CM | POA: Diagnosis not present

## 2023-01-10 DIAGNOSIS — Z800 Family history of malignant neoplasm of digestive organs: Secondary | ICD-10-CM | POA: Diagnosis not present

## 2023-01-10 DIAGNOSIS — K648 Other hemorrhoids: Secondary | ICD-10-CM | POA: Diagnosis not present

## 2023-01-10 DIAGNOSIS — D124 Benign neoplasm of descending colon: Secondary | ICD-10-CM | POA: Diagnosis not present

## 2023-01-10 DIAGNOSIS — I10 Essential (primary) hypertension: Secondary | ICD-10-CM | POA: Insufficient documentation

## 2023-01-10 HISTORY — PX: COLONOSCOPY WITH PROPOFOL: SHX5780

## 2023-01-10 HISTORY — PX: POLYPECTOMY: SHX5525

## 2023-01-10 LAB — GLUCOSE, CAPILLARY: Glucose-Capillary: 110 mg/dL — ABNORMAL HIGH (ref 70–99)

## 2023-01-10 SURGERY — COLONOSCOPY WITH PROPOFOL
Anesthesia: General

## 2023-01-10 MED ORDER — LACTATED RINGERS IV SOLN: INTRAVENOUS | Status: DC | PRN

## 2023-01-10 MED ORDER — LIDOCAINE 2% (20 MG/ML) 5 ML SYRINGE
INTRAMUSCULAR | Status: DC | PRN
  Administered 2023-01-10: 60 mg via INTRAVENOUS

## 2023-01-10 MED ORDER — PROPOFOL 10 MG/ML IV BOLUS
INTRAVENOUS | Status: DC | PRN
  Administered 2023-01-10 (×5): 30 mg via INTRAVENOUS
  Administered 2023-01-10 (×2): 50 mg via INTRAVENOUS
  Administered 2023-01-10: 20 mg via INTRAVENOUS

## 2023-01-10 MED ORDER — STERILE WATER FOR IRRIGATION IR SOLN
Status: DC | PRN
  Administered 2023-01-10: 60 mL

## 2023-01-10 NOTE — Discharge Instructions (Addendum)
  Colonoscopy Discharge Instructions  Read the instructions outlined below and refer to this sheet in the next few weeks. These discharge instructions provide you with general information on caring for yourself after you leave the hospital. Your doctor may also give you specific instructions. While your treatment has been planned according to the most current medical practices available, unavoidable complications occasionally occur.   ACTIVITY You may resume your regular activity, but move at a slower pace for the next 24 hours.  Take frequent rest periods for the next 24 hours.  Walking will help get rid of the air and reduce the bloated feeling in your belly (abdomen).  No driving for 24 hours (because of the medicine (anesthesia) used during the test).   Do not sign any important legal documents or operate any machinery for 24 hours (because of the anesthesia used during the test).  NUTRITION Drink plenty of fluids.  You may resume your normal diet as instructed by your doctor.  Begin with a light meal and progress to your normal diet. Heavy or fried foods are harder to digest and may make you feel sick to your stomach (nauseated).  Avoid alcoholic beverages for 24 hours or as instructed.  MEDICATIONS You may resume your normal medications unless your doctor tells you otherwise.  WHAT YOU CAN EXPECT TODAY Some feelings of bloating in the abdomen.  Passage of more gas than usual.  Spotting of blood in your stool or on the toilet paper.  IF YOU HAD POLYPS REMOVED DURING THE COLONOSCOPY: No aspirin products for 7 days or as instructed.  No alcohol for 7 days or as instructed.  Eat a soft diet for the next 24 hours.  FINDING OUT THE RESULTS OF YOUR TEST Not all test results are available during your visit. If your test results are not back during the visit, make an appointment with your caregiver to find out the results. Do not assume everything is normal if you have not heard from your  caregiver or the medical facility. It is important for you to follow up on all of your test results.  SEEK IMMEDIATE MEDICAL ATTENTION IF: You have more than a spotting of blood in your stool.  Your belly is swollen (abdominal distention).  You are nauseated or vomiting.  You have a temperature over 101.  You have abdominal pain or discomfort that is severe or gets worse throughout the day.   Your colonoscopy revealed 2 polyp(s) which I removed successfully. Await pathology results, my office will contact you. I recommend repeating colonoscopy in 5 years for surveillance purposes. Otherwise follow up with GI as needed.    I hope you have a great rest of your week!  Charles K. Carver, D.O. Gastroenterology and Hepatology [facility]  

## 2023-01-10 NOTE — Transfer of Care (Signed)
Immediate Anesthesia Transfer of Care Note  Patient: [name]  Procedure(s) Performed: COLONOSCOPY WITH PROPOFOL POLYPECTOMY  Patient Location: PACU  Anesthesia Type:MAC  Level of Consciousness: awake, alert , and oriented  Airway & Oxygen Therapy: Patient Spontanous Breathing  Post-op Assessment: Report given to RN, Post -op Vital signs reviewed and stable, and Post -op Vital signs reviewed and unstable, Anesthesiologist notified  Post vital signs: Reviewed and stable  Last Vitals:  Vitals Value Taken Time  BP 115/84 01/10/23 1058  Temp 36.9 C 01/10/23 1056  Pulse 88 01/10/23 1058  Resp 16 01/10/23 1058  SpO2 98 % 01/10/23 1058    Last Pain:  Vitals:   01/10/23 1056  TempSrc: Oral  PainSc: 0-No pain      Patients Stated Pain Goal: 8 (01/10/23 0914)  Complications: No notable events documented.

## 2023-01-10 NOTE — Op Note (Signed)
[facility] Patient Name: [name] Procedure Date: 01/10/2023 10:21 AM MRN: [id number] Date of Birth: 1980/09/09 Attending MD: Hennie Duos. Maple Mirza, [id number] CSN: [id number] Age: 42 Admit Type: Outpatient Procedure:                Colonoscopy Indications:              Screening in patient at increased risk: Colorectal                            cancer in sister before age 6 Providers:                Hennie Duos. Marletta Lor, DO, Angelica Ran, Yolanda Bonine Referring MD:             Hennie Duos. Marletta Lor, DO Medicines:                See the Anesthesia note for documentation of the                            administered medications Complications:            No immediate complications. Estimated Blood Loss:     Estimated blood loss was minimal. Procedure:                Pre-Anesthesia Assessment:                           - The anesthesia plan was to use monitored                            anesthesia care (MAC).                           After obtaining informed consent, the colonoscope                            was passed under direct vision. Throughout the                            procedure, the patient's blood pressure, pulse, and                            oxygen saturations were monitored continuously. The                            [id number]-[id number] ([id number]) scope was introduced through                            the anus and advanced to the the cecum, identified                            by appendiceal orifice and ileocecal valve. The  colonoscopy was performed without difficulty. The                            patient tolerated the procedure well. The quality                            of the bowel preparation was evaluated using the                            BBPS [facility] Bowel Preparation Scale) with scores                            of: Right Colon = 3, Transverse Colon = 3 and Left                             Colon = 3 (entire mucosa seen well with no residual                            staining, small fragments of stool or opaque                            liquid). The total BBPS score equals 9. Scope In: 10:36:07 AM Scope Out: 10:53:16 AM Scope Withdrawal Time: 0 hours 14 minutes 31 seconds  Total Procedure Duration: 0 hours 17 minutes 9 seconds  Findings:      Non-bleeding internal hemorrhoids were found during endoscopy.      Two sessile polyps were found in the descending colon. The polyps were 6       to 8 mm in size. These polyps were removed with a cold snare. Resection       and retrieval were complete.      The exam was otherwise without abnormality. Impression:               - Non-bleeding internal hemorrhoids.                           - Two 6 to 8 mm polyps in the descending colon,                            removed with a cold snare. Resected and retrieved.                           - The examination was otherwise normal. Moderate Sedation:      Per Anesthesia Care Recommendation:           - Patient has a contact number available for                            emergencies. The signs and symptoms of potential                            delayed complications were discussed with the                            patient.  Return to normal activities tomorrow.                            Written discharge instructions were provided to the                            patient.                           - Resume previous diet.                           - Continue present medications.                           - Await pathology results.                           - Repeat colonoscopy in 5 years for surveillance.                           - Return to GI clinic PRN. Procedure Code(s):        --- Professional ---                           [phone], Colonoscopy, flexible; with removal of                            tumor(s), polyp(s), or other lesion(s) by snare                             technique Diagnosis Code(s):        --- Professional ---                           Z80.0, Family history of malignant neoplasm of                            digestive organs                           D12.4, Benign neoplasm of descending colon                           K64.8, Other hemorrhoids CPT copyright 2022 American Medical Association. All rights reserved. The codes documented in this report are preliminary and upon coder review may  be revised to meet current compliance requirements. Hennie Duos. Marletta Lor, DO Hennie Duos. Marletta Lor, DO 01/10/2023 10:56:57 AM This report has been signed electronically. Number of Addenda: 0

## 2023-01-10 NOTE — H&P (Signed)
Primary Care Physician:  Lutricia Horsfall, MD Primary Gastroenterologist:  Dr. Marletta Lor  Pre-Procedure History & Physical: HPI:  [name] is a 42 y.o. female is here for a colonoscopy to be performed for high risk colon cancer screening purposes, family history of colon cancer in sister  Past Medical History:  Diagnosis Date   Anemia 01/20/2022   HBG 8.8 take OTC iron   Back pain    Depression    Diabetes (HCC) 01/20/2022   01/20/22 A1c is 7, will start lifestyle modifications and call PCP   Edema, lower extremity    Hypertension    PCOS (polycystic ovarian syndrome)     Past Surgical History:  Procedure Laterality Date   ABDOMINAL HYSTERECTOMY N/A 04/13/2022   Procedure: HYSTERECTOMY ABDOMINAL;  Surgeon: Myna Hidalgo, DO;  Location: [facility];  Service: Gynecology;  Laterality: N/A;   BILATERAL SALPINGECTOMY  04/13/2022   Procedure: OPEN BILATERAL SALPINGECTOMY;  Surgeon: Myna Hidalgo, DO;  Location: [facility];  Service: Gynecology;;   CESAREAN SECTION     2009, 2012, 2015   OOPHORECTOMY Left 04/13/2022   Procedure: OOPHORECTOMY;  Surgeon: Myna Hidalgo, DO;  Location: [facility];  Service: Gynecology;  Laterality: Left;   tubaligation     2015    Prior to Admission medications   Medication Sig Start Date End Date Taking? Authorizing Provider  cetirizine (ZYRTEC) 10 MG tablet Take 10 mg by mouth daily.   Yes [provider]  lisinopril (ZESTRIL) 5 MG tablet Take 5 mg by mouth daily. 12/28/21  Yes [provider]  metFORMIN (GLUCOPHAGE) 500 MG tablet Take 1 tablet (500 mg total) by mouth 2 (two) times daily with a meal. 04/11/22  Yes Adline Potter, NP    Allergies as of 12/14/2022   (No Known Allergies)    Family History  Problem Relation Age of Onset   Heart attack Paternal Grandfather    Other Paternal Grandmother        fatty liver   Cancer Maternal Grandfather        lung   Cancer Father        lung   Hypertension Father     Hyperlipidemia Father    Rheum arthritis Mother    Colon cancer Sister     Social History   Socioeconomic History   Marital status: Married    Spouse name: Not on file   Number of children: Not on file   Years of education: Not on file   Highest education level: Not on file  Occupational History   Occupation: homemaker  Tobacco Use   Smoking status: Never   Smokeless tobacco: Never  Vaping Use   Vaping status: Never Used  Substance and Sexual Activity   Alcohol use: Never   Drug use: Never   Sexual activity: Yes    Birth control/protection: Surgical    Comment: tubal/hyst  Other Topics Concern   Not on file  Social History Narrative   Not on file   Social Determinants of Health   Financial Resource Strain: Low Risk  (01/19/2022)   Overall Financial Resource Strain (CARDIA)    Difficulty of Paying Living Expenses: Not hard at all  Food Insecurity: No Food Insecurity (01/19/2022)   Hunger Vital Sign    Worried About Running Out of Food in the Last Year: Never true    Ran Out of Food in the Last Year: Never true  Transportation Needs: No Transportation Needs (01/19/2022)   PRAPARE - Transportation  Lack of Transportation (Medical): No    Lack of Transportation (Non-Medical): No  Physical Activity: Insufficiently Active (01/19/2022)   Exercise Vital Sign    Days of Exercise per Week: 2 days    Minutes of Exercise per Session: 30 min  Stress: Stress Concern Present (01/19/2022)   [employer] of Occupational Health - Occupational Stress Questionnaire    Feeling of Stress : To some extent  Social Connections: Socially Integrated (01/19/2022)   Social Connection and Isolation Panel [NHANES]    Frequency of Communication with Friends and Family: Three times a week    Frequency of Social Gatherings with Friends and Family: Once a week    Attends Religious Services: More than 4 times per year    Active Member of [employer] or Organizations: Yes    Attends Museum/gallery exhibitions officer: More than 4 times per year    Marital Status: Married  Catering manager Violence: Not At Risk (01/19/2022)   Humiliation, Afraid, Rape, and Kick questionnaire    Fear of Current or Ex-Partner: No    Emotionally Abused: No    Physically Abused: No    Sexually Abused: No    Review of Systems: See HPI, otherwise negative ROS  Physical Exam: Vital signs in last 24 hours: Temp:  [98.7 F (37.1 C)] 98.7 F (37.1 C) (09/10 0914) Pulse Rate:  [99] 99 (09/10 0914) Resp:  [16] 16 (09/10 0914) BP: (145)/(97) 145/97 (09/10 0914) SpO2:  [98 %] 98 % (09/10 0914) Weight:  [122.5 kg] 122.5 kg (09/10 0914)   General:   Alert,  Well-developed, well-nourished, pleasant and cooperative in NAD Head:  Normocephalic and atraumatic. Eyes:  Sclera clear, no icterus.   Conjunctiva pink. Ears:  Normal auditory acuity. Nose:  No deformity, discharge,  or lesions. Mouth:  No deformity or lesions, dentition normal. Neck:  Supple; no masses or thyromegaly. Lungs:  Clear throughout to auscultation.   No wheezes, crackles, or rhonchi. No acute distress. Heart:  Regular rate and rhythm; no murmurs, clicks, rubs,  or gallops. Abdomen:  Soft, nontender and nondistended. No masses, hepatosplenomegaly or hernias noted. Normal bowel sounds, without guarding, and without rebound.   Msk:  Symmetrical without gross deformities. Normal posture. Extremities:  Without clubbing or edema. Neurologic:  Alert and  oriented x4;  grossly normal neurologically. Skin:  Intact without significant lesions or rashes. Cervical Nodes:  No significant cervical adenopathy. Psych:  Alert and cooperative. Normal mood and affect.  Impression/Plan: [name] is here for a colonoscopy to be performed for high risk colon cancer screening purposes, family history of colon cancer in sister  The risks of the procedure including infection, bleed, or perforation as well as benefits, limitations, alternatives and  imponderables have been reviewed with the patient. Questions have been answered. All parties agreeable.

## 2023-01-11 LAB — SURGICAL PATHOLOGY

## 2023-01-11 NOTE — Anesthesia Preprocedure Evaluation (Signed)
Anesthesia Evaluation  Patient identified by MRN, date of birth, ID band Patient awake    Reviewed: Allergy & Precautions, H&P , NPO status , Patient's Chart, lab work & pertinent test results, reviewed documented beta blocker date and time   Airway Mallampati: II  TM Distance: >3 FB Neck ROM: full    Dental no notable dental hx.    Pulmonary neg pulmonary ROS   Pulmonary exam normal breath sounds clear to auscultation       Cardiovascular Exercise Tolerance: Good hypertension, negative cardio ROS  Rhythm:regular Rate:Normal     Neuro/Psych  PSYCHIATRIC DISORDERS  Depression    negative neurological ROS  negative psych ROS   GI/Hepatic negative GI ROS, Neg liver ROS,,,  Endo/Other  negative endocrine ROSdiabetes    Renal/GU negative Renal ROS  negative genitourinary   Musculoskeletal   Abdominal   Peds  Hematology negative hematology ROS (+) Blood dyscrasia, anemia   Anesthesia Other Findings   Reproductive/Obstetrics negative OB ROS                             Anesthesia Physical Anesthesia Plan  ASA: 3  Anesthesia Plan: General   Post-op Pain Management:    Induction:   PONV Risk Score and Plan: Propofol infusion  Airway Management Planned:   Additional Equipment:   Intra-op Plan:   Post-operative Plan:   Informed Consent: I have reviewed the patients History and Physical, chart, labs and discussed the procedure including the risks, benefits and alternatives for the proposed anesthesia with the patient or authorized representative who has indicated his/her understanding and acceptance.     Dental Advisory Given  Plan Discussed with: CRNA  Anesthesia Plan Comments:        Anesthesia Quick Evaluation

## 2023-01-11 NOTE — Anesthesia Postprocedure Evaluation (Signed)
Anesthesia Post Note  Patient: [name]  Procedure(s) Performed: COLONOSCOPY WITH PROPOFOL POLYPECTOMY  Patient location during evaluation: Phase II Anesthesia Type: General Level of consciousness: awake Pain management: pain level controlled Vital Signs Assessment: post-procedure vital signs reviewed and stable Respiratory status: spontaneous breathing and respiratory function stable Cardiovascular status: blood pressure returned to baseline and stable Postop Assessment: no headache and no apparent nausea or vomiting Anesthetic complications: no Comments: Late entry   No notable events documented.   Last Vitals:  Vitals:   01/10/23 1056 01/10/23 1058  BP: 115/84 115/84  Pulse: 85 88  Resp: (!) 21 16  Temp: 36.9 C   SpO2: 98% 98%    Last Pain:  Vitals:   01/10/23 1056  TempSrc: Oral  PainSc: 0-No pain                 Windell Norfolk

## 2023-01-19 ENCOUNTER — Encounter (HOSPITAL_COMMUNITY): Payer: Self-pay | Admitting: Internal Medicine

## 2023-02-10 DIAGNOSIS — Z6838 Body mass index (BMI) 38.0-38.9, adult: Secondary | ICD-10-CM | POA: Diagnosis not present

## 2023-02-10 DIAGNOSIS — Z131 Encounter for screening for diabetes mellitus: Secondary | ICD-10-CM | POA: Diagnosis not present

## 2023-02-17 DIAGNOSIS — Z13220 Encounter for screening for lipoid disorders: Secondary | ICD-10-CM | POA: Diagnosis not present

## 2023-02-17 DIAGNOSIS — E119 Type 2 diabetes mellitus without complications: Secondary | ICD-10-CM | POA: Diagnosis not present

## 2023-02-17 DIAGNOSIS — I10 Essential (primary) hypertension: Secondary | ICD-10-CM | POA: Diagnosis not present

## 2023-02-20 DIAGNOSIS — E119 Type 2 diabetes mellitus without complications: Secondary | ICD-10-CM | POA: Diagnosis not present

## 2023-05-09 ENCOUNTER — Other Ambulatory Visit: Payer: Self-pay | Admitting: Emergency Medicine

## 2023-05-09 DIAGNOSIS — Z1231 Encounter for screening mammogram for malignant neoplasm of breast: Secondary | ICD-10-CM

## 2023-06-01 ENCOUNTER — Ambulatory Visit
Admission: RE | Admit: 2023-06-01 | Discharge: 2023-06-01 | Disposition: A | Discharge status: Home / Self Care | Payer: 59 | Source: Ambulatory Visit | Attending: Emergency Medicine | Admitting: Emergency Medicine

## 2023-06-01 DIAGNOSIS — Z1231 Encounter for screening mammogram for malignant neoplasm of breast: Secondary | ICD-10-CM | POA: Diagnosis not present

## 2023-07-02 ENCOUNTER — Ambulatory Visit
Admission: RE | Admit: 2023-07-02 | Discharge: 2023-07-02 | Disposition: A | Discharge status: Home / Self Care | Payer: Self-pay | Source: Ambulatory Visit | Attending: Nurse Practitioner | Admitting: Nurse Practitioner

## 2023-07-02 VITALS — BP 147/97 | HR 101 | Temp 98.7°F | Resp 18

## 2023-07-02 DIAGNOSIS — R059 Cough, unspecified: Secondary | ICD-10-CM

## 2023-07-02 DIAGNOSIS — J309 Allergic rhinitis, unspecified: Secondary | ICD-10-CM

## 2023-07-02 LAB — POC COVID19/FLU A&B COMBO
Covid Antigen, POC: NEGATIVE
Influenza A Antigen, POC: NEGATIVE
Influenza B Antigen, POC: NEGATIVE

## 2023-07-02 MED ORDER — FLUTICASONE PROPIONATE 50 MCG/ACT NA SUSP: 2.0000 | Freq: Every day | NASAL | 0 refills | Status: AC

## 2023-07-02 MED ORDER — OLOPATADINE HCL 0.1 % OP SOLN: 1.0000 [drp] | Freq: Two times a day (BID) | OPHTHALMIC | 0 refills | Status: AC

## 2023-07-02 MED ORDER — LEVOCETIRIZINE DIHYDROCHLORIDE 5 MG PO TABS: 5.0000 mg | ORAL_TABLET | Freq: Every evening | ORAL | 0 refills | Status: AC

## 2023-07-02 MED ORDER — PSEUDOEPH-BROMPHEN-DM 30-2-10 MG/5ML PO SYRP: 5.0000 mL | ORAL_SOLUTION | Freq: Four times a day (QID) | ORAL | 0 refills | Status: AC | PRN

## 2023-07-02 NOTE — ED Provider Notes (Signed)
 [facility]    CSN: [id number] Arrival date & time: 07/02/23  1244      History   Chief Complaint Chief Complaint  Patient presents with   Headache    Cough possibly allergy related - Entered by patient    HPI [name] is a 43 y.o. female.   The history is provided by the patient.   Patient presents with a 4 to 5-day history of cough, headache, nasal congestion, and dry eyes.  Patient reports prior history of seasonal allergies.  Denies fever, chills, body aches, ear pain, wheezing, difficulty breathing, chest pain, abdominal pain, nausea, vomiting, diarrhea, or rash.  Patient reports she has been on Zyrtec for possible allergies to her dog.  Past Medical History:  Diagnosis Date   Anemia 01/20/2022   HBG 8.8 take OTC iron   Back pain    Depression    Diabetes (HCC) 01/20/2022   01/20/22 A1c is 7, will start lifestyle modifications and call PCP   Edema, lower extremity    Hypertension    PCOS (polycystic ovarian syndrome)     Patient Active Problem List   Diagnosis Date Noted   Left ovarian cyst 02/02/2022   Anemia 01/20/2022   Diabetes (HCC) 01/20/2022   Menometrorrhagia 01/19/2022   Pregnancy examination or test, negative result 01/19/2022   Menstrual periods irregular 01/19/2022   Screening for diabetes mellitus 01/19/2022    Past Surgical History:  Procedure Laterality Date   ABDOMINAL HYSTERECTOMY N/A 04/13/2022   Procedure: HYSTERECTOMY ABDOMINAL;  Surgeon: Myna Hidalgo, DO;  Location: [facility];  Service: Gynecology;  Laterality: N/A;   BILATERAL SALPINGECTOMY  04/13/2022   Procedure: OPEN BILATERAL SALPINGECTOMY;  Surgeon: Myna Hidalgo, DO;  Location: [facility];  Service: Gynecology;;   CESAREAN SECTION     2009, 2012, 2015   COLONOSCOPY WITH PROPOFOL N/A 01/10/2023   Procedure: COLONOSCOPY WITH PROPOFOL;  Surgeon: Lanelle Bal, DO;  Location: [facility];  Service: Endoscopy;  Laterality: N/A;  1030am, asa 2   OOPHORECTOMY Left  04/13/2022   Procedure: OOPHORECTOMY;  Surgeon: Myna Hidalgo, DO;  Location: [facility];  Service: Gynecology;  Laterality: Left;   POLYPECTOMY  01/10/2023   Procedure: POLYPECTOMY;  Surgeon: Lanelle Bal, DO;  Location: [facility];  Service: Endoscopy;;   tubaligation     2015    OB History     Gravida  4   Para  3   Term  2   Preterm  1   AB  1   Living  3      SAB  1   IAB      Ectopic      Multiple      Live Births  3            Home Medications    Prior to Admission medications   Medication Sig Start Date End Date Taking? Authorizing Provider  brompheniramine-pseudoephedrine-DM 30-2-10 MG/5ML syrup Take 5 mLs by mouth 4 (four) times daily as needed. 07/02/23  Yes Leath-Warren, Sadie Haber, NP  fluticasone (FLONASE) 50 MCG/ACT nasal spray Place 2 sprays into both nostrils daily. 07/02/23  Yes Leath-Warren, Sadie Haber, NP  levocetirizine (XYZAL) 5 MG tablet Take 1 tablet (5 mg total) by mouth every evening. 07/02/23  Yes Leath-Warren, Sadie Haber, NP  olopatadine (PATADAY) 0.1 % ophthalmic solution Place 1 drop into both eyes 2 (two) times daily. 07/02/23  Yes Leath-Warren, Sadie Haber, NP  cetirizine (ZYRTEC) 10 MG tablet  Take 10 mg by mouth daily.    [provider]  lisinopril (ZESTRIL) 5 MG tablet Take 5 mg by mouth daily. 12/28/21   [provider]  metFORMIN (GLUCOPHAGE) 500 MG tablet Take 1 tablet (500 mg total) by mouth 2 (two) times daily with a meal. 04/11/22   Adline Potter, NP    Family History Family History  Problem Relation Age of Onset   Heart attack Paternal Grandfather    Other Paternal Grandmother        fatty liver   Cancer Maternal Grandfather        lung   Cancer Father        lung   Hypertension Father    Hyperlipidemia Father    Rheum arthritis Mother    Colon cancer Sister     Social History Social History   Tobacco Use   Smoking status: Never   Smokeless tobacco: Never  Vaping Use   Vaping  status: Never Used  Substance Use Topics   Alcohol use: Never   Drug use: Never     Allergies   Patient has no known allergies.   Review of Systems Review of Systems Per HPI  Physical Exam Triage Vital Signs ED Triage Vitals [07/02/23 1301]  Encounter Vitals Group     BP (!) 147/97     Systolic BP Percentile      Diastolic BP Percentile      Pulse Rate (!) 101     Resp 18     Temp 98.7 F (37.1 C)     Temp Source Oral     SpO2 98 %     Weight      Height      Head Circumference      Peak Flow      Pain Score 3     Pain Loc      Pain Education      Exclude from Growth Chart    No data found.  Updated Vital Signs BP (!) 147/97 (BP Location: Right Arm)   Pulse (!) 101   Temp 98.7 F (37.1 C) (Oral)   Resp 18   LMP 01/12/2022   SpO2 98%   Visual Acuity Right Eye Distance:   Left Eye Distance:   Bilateral Distance:    Right Eye Near:   Left Eye Near:    Bilateral Near:     Physical Exam Vitals and nursing note reviewed.  Constitutional:      General: She is not in acute distress.    Appearance: She is well-developed.  HENT:     Head: Normocephalic.     Right Ear: Tympanic membrane, ear canal and external ear normal.     Left Ear: Tympanic membrane, ear canal and external ear normal.     Nose: Congestion present.     Right Turbinates: Enlarged and swollen.     Left Turbinates: Enlarged and swollen.     Right Sinus: No maxillary sinus tenderness or frontal sinus tenderness.     Left Sinus: No maxillary sinus tenderness or frontal sinus tenderness.     Mouth/Throat:     Lips: Pink.     Mouth: Mucous membranes are moist.     Pharynx: Uvula midline. Postnasal drip present. No pharyngeal swelling, oropharyngeal exudate, posterior oropharyngeal erythema or uvula swelling.  Eyes:     Extraocular Movements: Extraocular movements intact.     Conjunctiva/sclera: Conjunctivae normal.     Pupils: Pupils are equal, round, and  reactive to light.   Cardiovascular:     Rate and Rhythm: Regular rhythm.     Pulses: Normal pulses.     Heart sounds: Normal heart sounds.  Pulmonary:     Effort: Pulmonary effort is normal. No respiratory distress.     Breath sounds: Normal breath sounds. No stridor. No wheezing, rhonchi or rales.  Abdominal:     General: Bowel sounds are normal.     Palpations: Abdomen is soft.     Tenderness: There is no abdominal tenderness.  Musculoskeletal:     Cervical back: Normal range of motion.  Lymphadenopathy:     Cervical: No cervical adenopathy.  Skin:    General: Skin is warm and dry.  Neurological:     General: No focal deficit present.     Mental Status: She is alert and oriented to person, place, and time.  Psychiatric:        Mood and Affect: Mood normal.        Behavior: Behavior normal.      UC Treatments / Results  Labs (all labs ordered are listed, but only abnormal results are displayed) Labs Reviewed  POC COVID19/FLU A&B COMBO - Normal    EKG   Radiology No results found.  Procedures Procedures (including critical care time)  Medications Ordered in UC Medications - No data to display  Initial Impression / Assessment and Plan / UC Course  I have reviewed the triage vital signs and the nursing notes.  Pertinent labs & imaging results that were available during my care of the patient were reviewed by me and considered in my medical decision making (see chart for details).  COVID/flu test was negative.  Symptoms are consistent with allergic rhinitis.  Will start levocetirizine 5 mg as an antihistamine, fluticasone 50 mcg nasal spray for nasal congestion, and Bromfed-DM for the cough, and Pataday 0.1% solution for dry eyes.  Supportive care recommendations were provided and discussed with the patient to include fluids, rest, normal saline nasal spray, and use of a humidifier during sleep.  Discussed indications with the patient regarding follow-up.  Patient was in agreement with  this plan of care and verbalized understanding.  All questions were answered.  Patient stable for discharge.   Final Clinical Impressions(s) / UC Diagnoses   Final diagnoses:  Allergic rhinitis, unspecified seasonality, unspecified trigger  Cough, unspecified type     Discharge Instructions      COVID/flu test was negative. Stop the Zyrtec, begin levocetirizine at bedtime.  If your allergy symptoms worsen, you can begin Zyrtec in the morning. May take over-the-counter Tylenol or ibuprofen as needed for pain, fever, or general discomfort. Recommend Visine or Clear Eyes eyedrops to help keep the eyes moist and lubricated. May use normal saline nasal spray throughout the day for nasal congestion and runny nose. For the cough, recommend using a humidifier in your bedroom at nighttime during sleep and sleeping elevated on pillows while cough symptoms persist. If symptoms fail to improve, you may follow-up in this clinic or with your primary care physician for further evaluation. Follow-up as needed.     ED Prescriptions     Medication Sig Dispense Auth. Provider   brompheniramine-pseudoephedrine-DM 30-2-10 MG/5ML syrup Take 5 mLs by mouth 4 (four) times daily as needed. 140 mL Leath-Warren, Sadie Haber, NP   levocetirizine (XYZAL) 5 MG tablet Take 1 tablet (5 mg total) by mouth every evening. 30 tablet Leath-Warren, Sadie Haber, NP   fluticasone (FLONASE) 50 MCG/ACT nasal spray  Place 2 sprays into both nostrils daily. 16 g Leath-Warren, Sadie Haber, NP   olopatadine (PATADAY) 0.1 % ophthalmic solution Place 1 drop into both eyes 2 (two) times daily. 5 mL Leath-Warren, Sadie Haber, NP      PDMP not reviewed this encounter.   Abran Cantor, NP 07/02/23 1334

## 2023-07-02 NOTE — ED Triage Notes (Signed)
 Cough, headache x 3 days.  States eyes feel dry.

## 2023-07-02 NOTE — Discharge Instructions (Addendum)
 COVID/flu test was negative. Stop the Zyrtec, begin levocetirizine at bedtime.  If your allergy symptoms worsen, you can begin Zyrtec in the morning. May take over-the-counter Tylenol or ibuprofen as needed for pain, fever, or general discomfort. Recommend Visine or Clear Eyes eyedrops to help keep the eyes moist and lubricated. May use normal saline nasal spray throughout the day for nasal congestion and runny nose. For the cough, recommend using a humidifier in your bedroom at nighttime during sleep and sleeping elevated on pillows while cough symptoms persist. If symptoms fail to improve, you may follow-up in this clinic or with your primary care physician for further evaluation. Follow-up as needed.

## 2023-07-14 ENCOUNTER — Telehealth: Payer: Self-pay | Admitting: Emergency Medicine

## 2023-07-14 ENCOUNTER — Ambulatory Visit
Admission: RE | Admit: 2023-07-14 | Discharge: 2023-07-14 | Disposition: A | Discharge status: Home / Self Care | Source: Ambulatory Visit | Attending: Family Medicine | Admitting: Family Medicine

## 2023-07-14 ENCOUNTER — Other Ambulatory Visit: Payer: Self-pay | Admitting: Family Medicine

## 2023-07-14 VITALS — BP 128/89 | HR 88 | Temp 98.5°F | Resp 20

## 2023-07-14 DIAGNOSIS — M7661 Achilles tendinitis, right leg: Secondary | ICD-10-CM

## 2023-07-14 MED ORDER — DEXAMETHASONE SODIUM PHOSPHATE 10 MG/ML IJ SOLN
10.0000 mg | Freq: Once | INTRAMUSCULAR | Status: AC
  Administered 2023-07-14: 10 mg via INTRAMUSCULAR

## 2023-07-14 MED ORDER — CYCLOBENZAPRINE HCL 10 MG PO TABS: 5.0000 mg | ORAL_TABLET | Freq: Three times a day (TID) | ORAL | 0 refills | Status: AC | PRN

## 2023-07-14 MED ORDER — TIZANIDINE HCL 2 MG PO CAPS: 2.0000 mg | ORAL_CAPSULE | Freq: Every evening | ORAL | 0 refills | Status: DC | PRN

## 2023-07-14 NOTE — ED Triage Notes (Signed)
 Pt reports she has right achilles pain that is radiating up to her legs x 1.5 weeks.   Denies injury

## 2023-07-14 NOTE — Telephone Encounter (Signed)
 Pt called and reported pharmacy messaged and stated there was a problem with the prescription and to have UC call pharmacy. Reached out to Pharmacy, pharmacy reported insurance wouldn't cover tizanidine prescription and recommended it be changed. Consulted provider and given verbal order to electronically send in cyclobenzaprine 5mg  PO TID PRN. Pt aware and px electronically ordered.

## 2023-07-14 NOTE — ED Provider Notes (Signed)
 [facility]    CSN: [id number] Arrival date & time: 07/14/23  1003      History   Chief Complaint Chief Complaint  Patient presents with   Foot Pain    Entered by patient    HPI [name] is a 43 y.o. female.   Patient presenting today with about a week and a half of right posterior heel pain sometimes extending to the bottom of foot.  States the pain is worsening and is now feeling swollen in the Achilles region.  Denies redness, bruising, numbness, tingling, known injury to the area, fevers.  Trying massage, over-the-counter medications with minimal relief.    Past Medical History:  Diagnosis Date   Anemia 01/20/2022   HBG 8.8 take OTC iron   Back pain    Depression    Diabetes (HCC) 01/20/2022   01/20/22 A1c is 7, will start lifestyle modifications and call PCP   Edema, lower extremity    Hypertension    PCOS (polycystic ovarian syndrome)     Patient Active Problem List   Diagnosis Date Noted   Left ovarian cyst 02/02/2022   Anemia 01/20/2022   Diabetes (HCC) 01/20/2022   Menometrorrhagia 01/19/2022   Pregnancy examination or test, negative result 01/19/2022   Menstrual periods irregular 01/19/2022   Screening for diabetes mellitus 01/19/2022    Past Surgical History:  Procedure Laterality Date   ABDOMINAL HYSTERECTOMY N/A 04/13/2022   Procedure: HYSTERECTOMY ABDOMINAL;  Surgeon: Myna Hidalgo, DO;  Location: [facility];  Service: Gynecology;  Laterality: N/A;   BILATERAL SALPINGECTOMY  04/13/2022   Procedure: OPEN BILATERAL SALPINGECTOMY;  Surgeon: Myna Hidalgo, DO;  Location: [facility];  Service: Gynecology;;   CESAREAN SECTION     2009, 2012, 2015   COLONOSCOPY WITH PROPOFOL N/A 01/10/2023   Procedure: COLONOSCOPY WITH PROPOFOL;  Surgeon: Lanelle Bal, DO;  Location: [facility];  Service: Endoscopy;  Laterality: N/A;  1030am, asa 2   OOPHORECTOMY Left 04/13/2022   Procedure: OOPHORECTOMY;  Surgeon: Myna Hidalgo, DO;  Location: [facility];  Service: Gynecology;  Laterality: Left;   POLYPECTOMY  01/10/2023   Procedure: POLYPECTOMY;  Surgeon: Lanelle Bal, DO;  Location: [facility];  Service: Endoscopy;;   tubaligation     2015    OB History     Gravida  4   Para  3   Term  2   Preterm  1   AB  1   Living  3      SAB  1   IAB      Ectopic      Multiple      Live Births  3            Home Medications    Prior to Admission medications   Medication Sig Start Date End Date Taking? Authorizing Provider  brompheniramine-pseudoephedrine-DM 30-2-10 MG/5ML syrup Take 5 mLs by mouth 4 (four) times daily as needed. 07/02/23   Leath-Warren, Sadie Haber, NP  cetirizine (ZYRTEC) 10 MG tablet Take 10 mg by mouth daily.    [provider]  cyclobenzaprine (FLEXERIL) 10 MG tablet Take 0.5 tablets (5 mg total) by mouth 3 (three) times daily as needed for muscle spasms. 07/14/23   Particia Nearing, PA-C  fluticasone [facility]) 50 MCG/ACT nasal spray Place 2 sprays into both nostrils daily. 07/02/23   Leath-Warren, Sadie Haber, NP  levocetirizine (XYZAL) 5 MG tablet Take 1 tablet (5 mg total) by mouth every evening. 07/02/23  Leath-Warren, Sadie Haber, NP  lisinopril (ZESTRIL) 5 MG tablet Take 5 mg by mouth daily. 12/28/21   [provider]  metFORMIN (GLUCOPHAGE) 500 MG tablet Take 1 tablet (500 mg total) by mouth 2 (two) times daily with a meal. 04/11/22   Adline Potter, NP  olopatadine (PATADAY) 0.1 % ophthalmic solution Place 1 drop into both eyes 2 (two) times daily. 07/02/23   Leath-Warren, Sadie Haber, NP    Family History Family History  Problem Relation Age of Onset   Heart attack Paternal Grandfather    Other Paternal Grandmother        fatty liver   Cancer Maternal Grandfather        lung   Cancer Father        lung   Hypertension Father    Hyperlipidemia Father    Rheum arthritis Mother    Colon cancer Sister     Social History Social History   Tobacco Use    Smoking status: Never   Smokeless tobacco: Never  Vaping Use   Vaping status: Never Used  Substance Use Topics   Alcohol use: Never   Drug use: Never     Allergies   Patient has no known allergies.   Review of Systems Review of Systems per HPI  Physical Exam Triage Vital Signs ED Triage Vitals  Encounter Vitals Group     BP 07/14/23 1055 128/89     Systolic BP Percentile --      Diastolic BP Percentile --      Pulse Rate 07/14/23 1055 88     Resp 07/14/23 1055 20     Temp 07/14/23 1055 98.5 F (36.9 C)     Temp Source 07/14/23 1055 Oral     SpO2 07/14/23 1055 98 %     Weight --      Height --      Head Circumference --      Peak Flow --      Pain Score 07/14/23 1056 7     Pain Loc --      Pain Education --      Exclude from Growth Chart --    No data found.  Updated Vital Signs BP 128/89 (BP Location: Right Arm)   Pulse 88   Temp 98.5 F (36.9 C) (Oral)   Resp 20   LMP 01/12/2022   SpO2 98%   Visual Acuity Right Eye Distance:   Left Eye Distance:   Bilateral Distance:    Right Eye Near:   Left Eye Near:    Bilateral Near:     Physical Exam Vitals and nursing note reviewed.  Constitutional:      Appearance: Normal appearance. She is not ill-appearing.  HENT:     Head: Atraumatic.  Eyes:     Extraocular Movements: Extraocular movements intact.     Conjunctiva/sclera: Conjunctivae normal.  Cardiovascular:     Rate and Rhythm: Normal rate.  Pulmonary:     Effort: Pulmonary effort is normal.  Musculoskeletal:        General: Swelling and tenderness present. No signs of injury. Normal range of motion.     Cervical back: Normal range of motion and neck supple.     Comments: Trace edema, tenderness to patient to the right Achilles.  No bony deformities palpable.  Range of motion intact  Skin:    General: Skin is warm and dry.     Findings: No bruising or erythema.  Neurological:  Mental Status: She is alert and oriented to person, place, and  time.     Motor: No weakness.     Gait: Gait normal.  Psychiatric:        Mood and Affect: Mood normal.        Thought Content: Thought content normal.        Judgment: Judgment normal.      UC Treatments / Results  Labs (all labs ordered are listed, but only abnormal results are displayed) Labs Reviewed - No data to display  EKG   Radiology No results found.  Procedures Procedures (including critical care time)  Medications Ordered in UC Medications  dexamethasone (DECADRON) injection 10 mg (10 mg Intramuscular Given 07/14/23 1115)    Initial Impression / Assessment and Plan / UC Course  I have reviewed the triage vital signs and the nursing notes.  Pertinent labs & imaging results that were available during my care of the patient were reviewed by me and considered in my medical decision making (see chart for details).     Will treat with IM Decadron, Zanaflex, heat, massage, stretches, Epsom salt soaks.  Return for worsening symptoms.  Final Clinical Impressions(s) / UC Diagnoses   Final diagnoses:  Achilles tendinitis of right lower extremity   Discharge Instructions   None    ED Prescriptions     Medication Sig Dispense Auth. Provider   tizanidine (ZANAFLEX) 2 MG capsule Take 1 capsule (2 mg total) by mouth at bedtime as needed for muscle spasms. Do not drink alcohol or drive while taking this medication.  May cause drowsiness. 10 capsule Particia Nearing, [place]      PDMP not reviewed this encounter.   Particia Nearing, [place] 07/17/23 1835

## 2023-10-03 IMAGING — MG DIGITAL DIAGNOSTIC BILAT W/ TOMO W/ CAD
8 of 15 series · 8 of 40 positions shown · non-contrast
Comparison: Previous exam(s).

CLINICAL DATA: 40-year-old female presenting as a recall from
baseline screening for possible right breast mass and possible left
breast distortion.

EXAM:
DIGITAL DIAGNOSTIC BILATERAL MAMMOGRAM WITH TOMOSYNTHESIS AND CAD;
ULTRASOUND RIGHT BREAST LIMITED
TECHNIQUE: Bilateral digital diagnostic mammography and breast tomosynthesis
was performed. The images were evaluated with computer-aided
detection.; Targeted ultrasound examination of the right breast was
performed

[R CC synth-2D (1 of 3)]
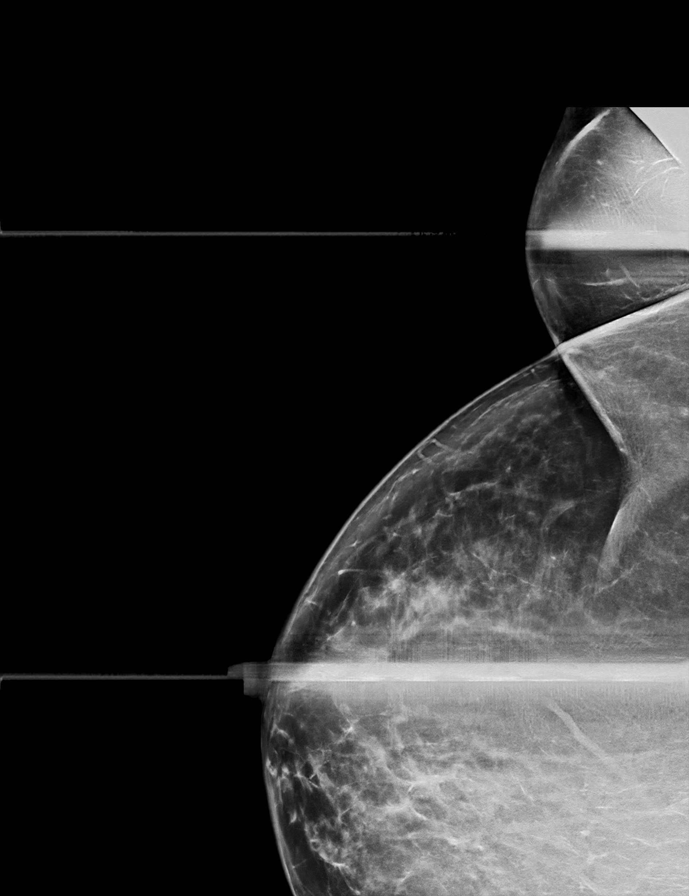

[R CC synth-2D (2 of 3)]
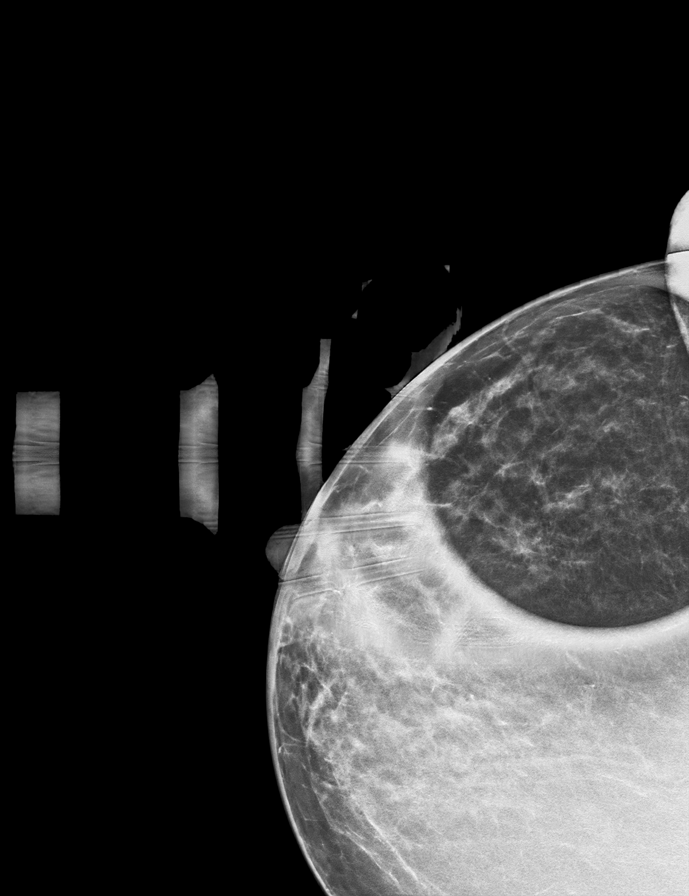

[L ML synth-2D (1 of 2)]
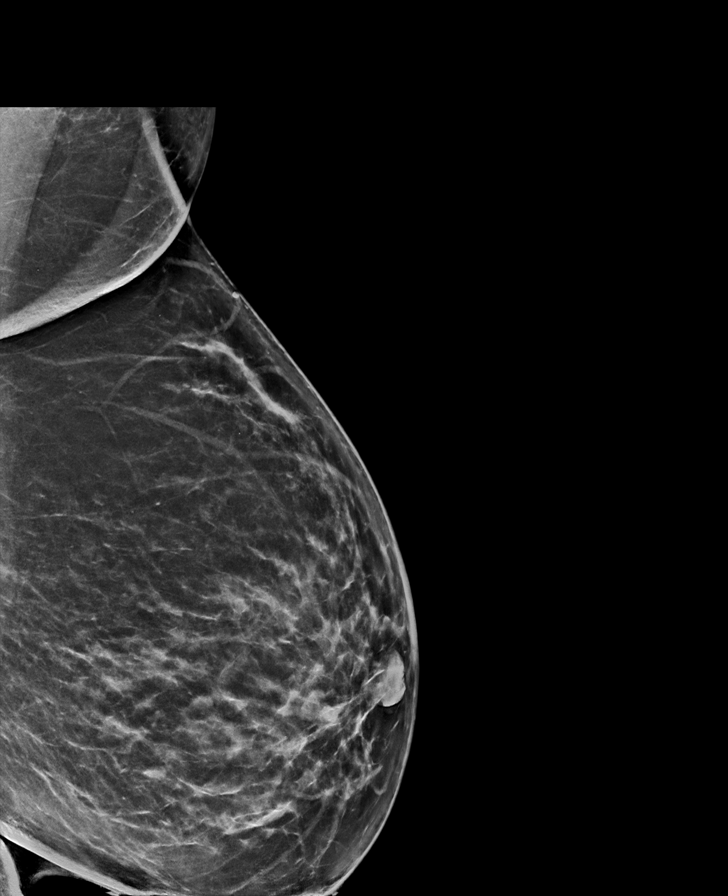

[L MLO synth-2D]
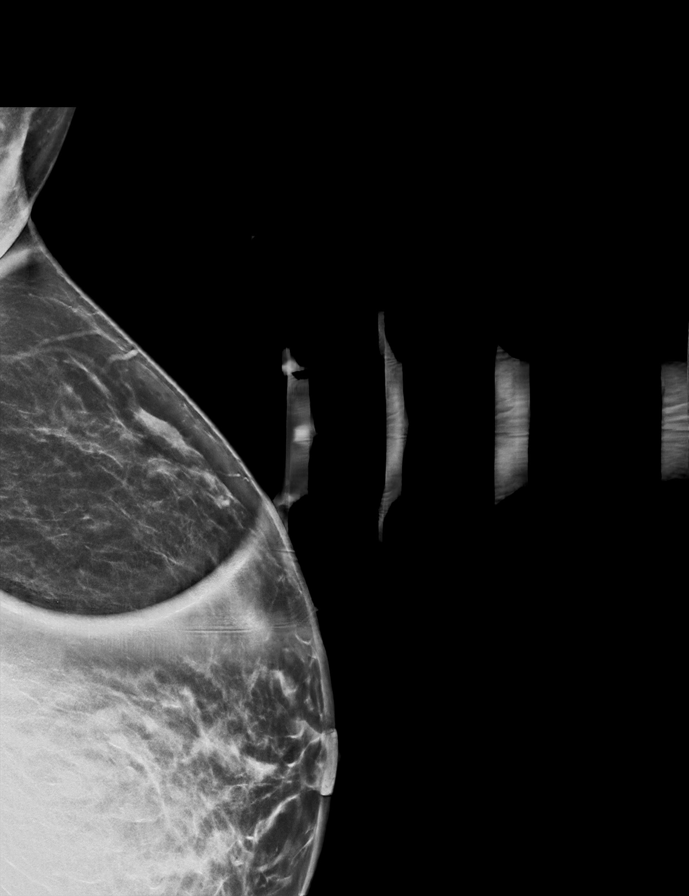

[R CC synth-2D (3 of 3)]
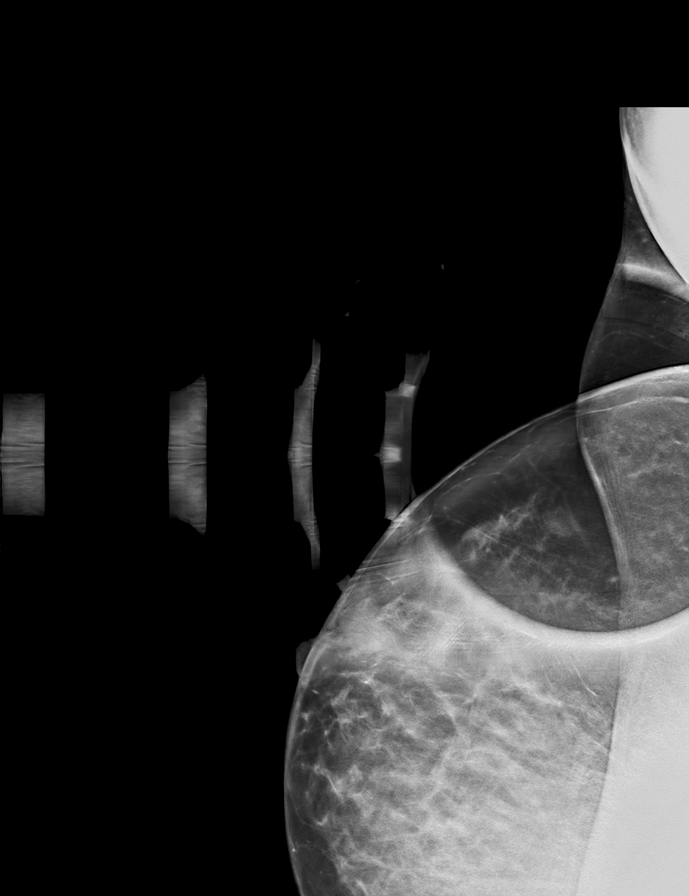

[R MLO synth-2D]
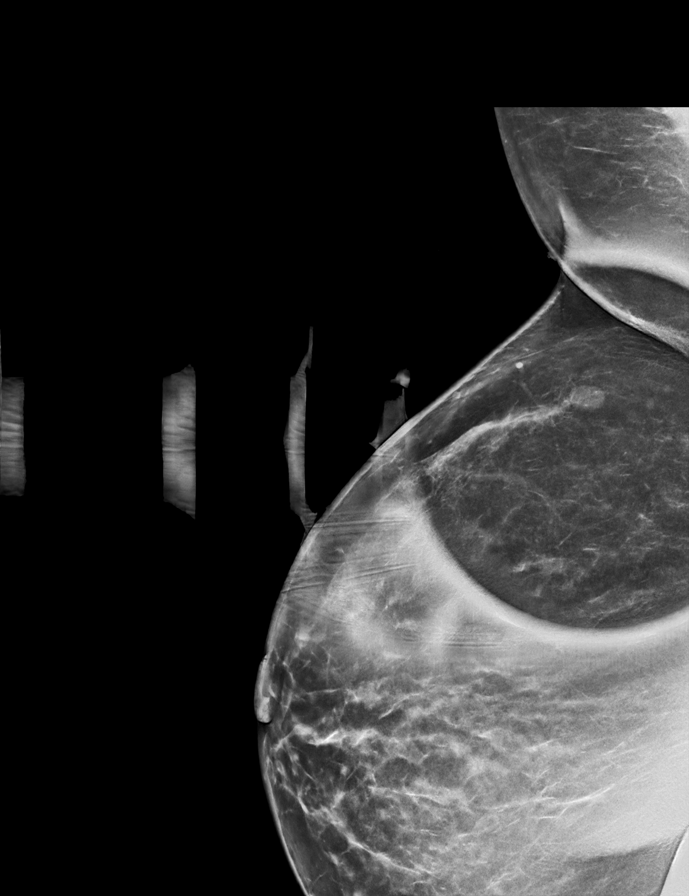

[L ML synth-2D (2 of 2)]
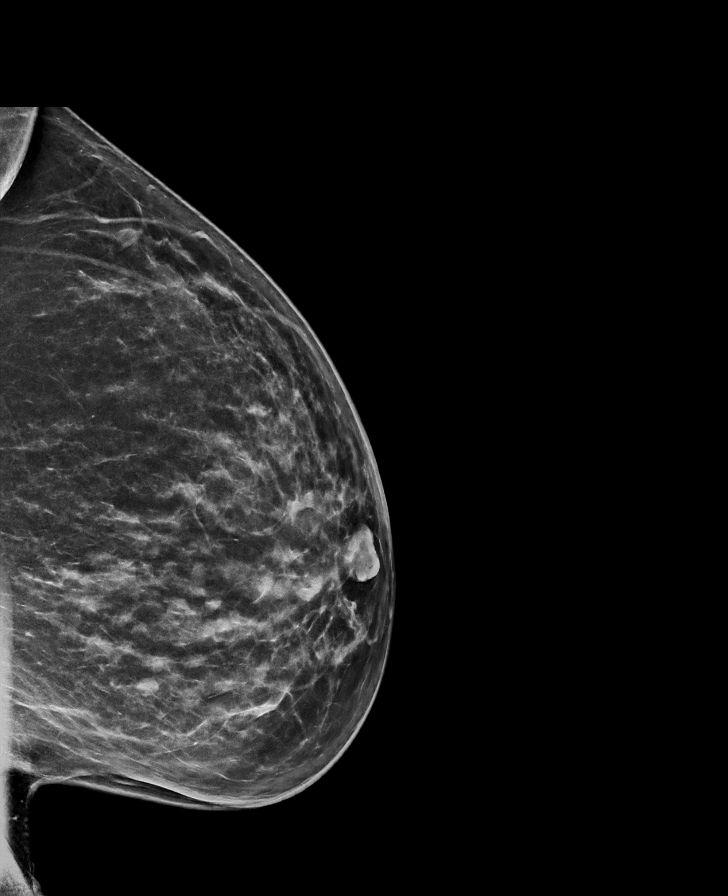

[L MLO tomo · tomo slice 53/77.0]
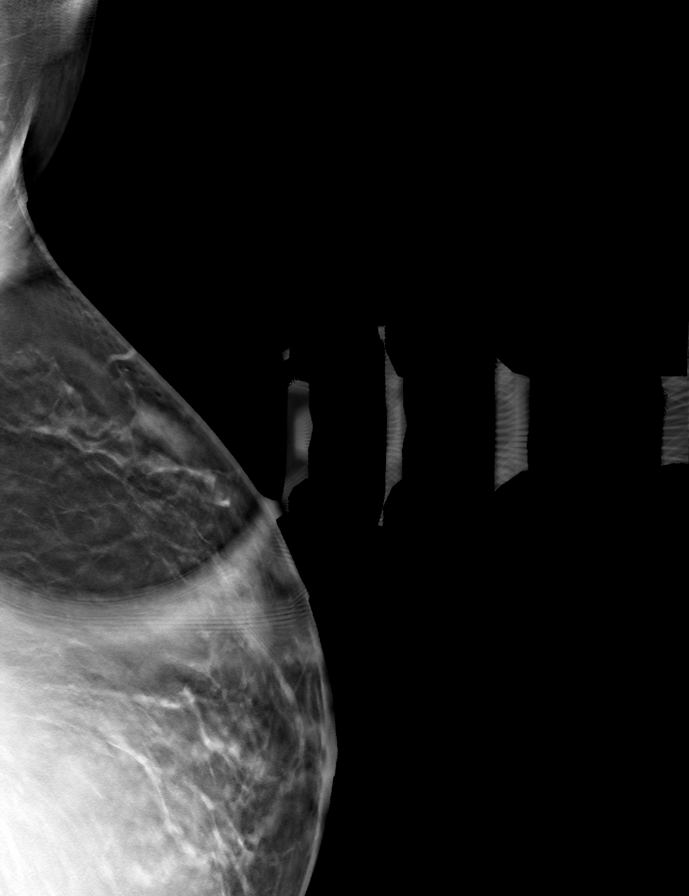

[8 of 40 positions shown; findings below may reference images not displayed]

ACR Breast Density Category c: The breast tissue is heterogeneously
dense, which may obscure small masses.
FINDINGS: Mammogram:

Right breast: Spot compression tomosynthesis views of the right
breast were performed demonstrating persistence of an oval
circumscribed mass in the upper outer right breast posterior depth
measuring approximately 0.8 cm.

Left breast: Spot compression tomosynthesis MLO and full field mL
tomosynthesis views of the left breast performed for a questioned
area of distortion seen only on MLO view in the superior left
breast. On the additional imaging the tissue in this area disperses
without persistent asymmetry, mass, or distortion.

Ultrasound:

Targeted ultrasound performed in the right breast at 10 o'clock 13
cm from the nipple demonstrating an oval circumscribed hypoechoic
mass with central fatty hilum measuring 0.7 x 0.4 x 0.5 cm,
consistent with a benign lymph node. At 10 o'clock 7 cm from nipple
there is an oval circumscribed anechoic mass measuring 0.8 x 0.4 x
0.7 cm, consistent with a benign simple cyst. This likely
corresponds to the mammographic finding.
IMPRESSION: 1. Benign simple cyst and intramammary lymph node in the right
breast.

2.  No mammographic evidence of malignancy in the left breast.

RECOMMENDATION:
Screening mammogram in one year.(Code:DH-4-EFM)

I have discussed the findings and recommendations with the patient.
If applicable, a reminder letter will be sent to the patient
regarding the next appointment.

BI-RADS CATEGORY  2: Benign.

## 2023-10-11 DIAGNOSIS — Z13220 Encounter for screening for lipoid disorders: Secondary | ICD-10-CM | POA: Diagnosis not present

## 2023-10-11 DIAGNOSIS — E119 Type 2 diabetes mellitus without complications: Secondary | ICD-10-CM | POA: Diagnosis not present

## 2023-10-11 DIAGNOSIS — I10 Essential (primary) hypertension: Secondary | ICD-10-CM | POA: Diagnosis not present

## 2023-10-11 DIAGNOSIS — R69 Illness, unspecified: Secondary | ICD-10-CM | POA: Diagnosis not present

## 2023-11-23 DIAGNOSIS — Z13220 Encounter for screening for lipoid disorders: Secondary | ICD-10-CM | POA: Diagnosis not present

## 2023-11-23 DIAGNOSIS — I10 Essential (primary) hypertension: Secondary | ICD-10-CM | POA: Diagnosis not present

## 2024-04-30 ENCOUNTER — Other Ambulatory Visit: Payer: Self-pay | Admitting: Physician Assistant

## 2024-04-30 DIAGNOSIS — Z1231 Encounter for screening mammogram for malignant neoplasm of breast: Secondary | ICD-10-CM

## 2024-05-10 ENCOUNTER — Other Ambulatory Visit: Payer: Self-pay | Admitting: Medical Genetics

## 2024-05-16 ENCOUNTER — Ambulatory Visit

## 2024-05-24 ENCOUNTER — Ambulatory Visit

## 2024-06-07 ENCOUNTER — Ambulatory Visit: Admission: RE | Admit: 2024-06-07 | Source: Ambulatory Visit

## 2024-06-07 DIAGNOSIS — Z1231 Encounter for screening mammogram for malignant neoplasm of breast: Secondary | ICD-10-CM
